# Patient Record
Sex: Male | Born: 1970 | Race: White | Hispanic: No | Marital: Married | Smoking: Never smoker
Health system: Southern US, Community
[De-identification: ages and names within clinical notes are randomized; demographics above are authoritative.]

## PROBLEM LIST (undated history)

## (undated) DIAGNOSIS — J309 Allergic rhinitis, unspecified: Secondary | ICD-10-CM

## (undated) DIAGNOSIS — Z973 Presence of spectacles and contact lenses: Secondary | ICD-10-CM

## (undated) DIAGNOSIS — G43909 Migraine, unspecified, not intractable, without status migrainosus: Secondary | ICD-10-CM

## (undated) DIAGNOSIS — M549 Dorsalgia, unspecified: Secondary | ICD-10-CM

## (undated) DIAGNOSIS — T7840XA Allergy, unspecified, initial encounter: Secondary | ICD-10-CM

## (undated) HISTORY — DX: Allergic rhinitis, unspecified: J30.9

## (undated) HISTORY — DX: Allergy, unspecified, initial encounter: T78.40XA

## (undated) HISTORY — DX: Presence of spectacles and contact lenses: Z97.3

## (undated) HISTORY — PX: LACERATION REPAIR: SHX5168

## (undated) HISTORY — DX: Dorsalgia, unspecified: M54.9

## (undated) HISTORY — PX: WISDOM TOOTH EXTRACTION: SHX21

## (undated) HISTORY — DX: Migraine, unspecified, not intractable, without status migrainosus: G43.909

---

## 2002-09-23 ENCOUNTER — Encounter: Payer: Self-pay | Admitting: Emergency Medicine

## 2002-09-23 ENCOUNTER — Emergency Department (HOSPITAL_COMMUNITY): Admission: EM | Admit: 2002-09-23 | Discharge: 2002-09-23 | Payer: Self-pay | Admitting: Emergency Medicine

## 2004-06-14 ENCOUNTER — Emergency Department (HOSPITAL_COMMUNITY): Admission: EM | Admit: 2004-06-14 | Discharge: 2004-06-14 | Payer: Self-pay | Admitting: Family Medicine

## 2005-06-13 ENCOUNTER — Emergency Department (HOSPITAL_COMMUNITY): Admission: EM | Admit: 2005-06-13 | Discharge: 2005-06-13 | Payer: Self-pay | Admitting: Family Medicine

## 2007-02-13 ENCOUNTER — Emergency Department (HOSPITAL_COMMUNITY): Admission: EM | Admit: 2007-02-13 | Discharge: 2007-02-13 | Payer: Self-pay | Admitting: Family Medicine

## 2007-12-13 ENCOUNTER — Emergency Department (HOSPITAL_COMMUNITY): Admission: EM | Admit: 2007-12-13 | Discharge: 2007-12-13 | Payer: Self-pay | Admitting: Family Medicine

## 2009-02-17 ENCOUNTER — Ambulatory Visit: Payer: Self-pay | Admitting: Family Medicine

## 2009-12-16 ENCOUNTER — Ambulatory Visit: Payer: Self-pay | Admitting: Family Medicine

## 2010-03-02 ENCOUNTER — Emergency Department (HOSPITAL_COMMUNITY): Admission: EM | Admit: 2010-03-02 | Discharge: 2010-03-03 | Payer: Self-pay | Admitting: Emergency Medicine

## 2010-03-02 ENCOUNTER — Emergency Department (HOSPITAL_COMMUNITY): Admission: EM | Admit: 2010-03-02 | Discharge: 2010-03-02 | Payer: Self-pay | Admitting: Emergency Medicine

## 2010-03-10 HISTORY — PX: HUMERUS FRACTURE SURGERY: SHX670

## 2010-10-10 HISTORY — PX: SHOULDER ARTHROSCOPY: SHX128

## 2011-07-28 ENCOUNTER — Emergency Department (HOSPITAL_COMMUNITY)
Admission: EM | Admit: 2011-07-28 | Discharge: 2011-07-29 | Disposition: A | Discharge status: Home / Self Care | Payer: BC Managed Care – PPO | Attending: Emergency Medicine | Admitting: Emergency Medicine

## 2011-07-28 ENCOUNTER — Emergency Department (HOSPITAL_COMMUNITY): Payer: BC Managed Care – PPO

## 2011-07-28 DIAGNOSIS — S0990XA Unspecified injury of head, initial encounter: Secondary | ICD-10-CM | POA: Insufficient documentation

## 2011-07-28 DIAGNOSIS — W108XXA Fall (on) (from) other stairs and steps, initial encounter: Secondary | ICD-10-CM | POA: Insufficient documentation

## 2011-07-28 DIAGNOSIS — S0180XA Unspecified open wound of other part of head, initial encounter: Secondary | ICD-10-CM | POA: Insufficient documentation

## 2011-07-29 ENCOUNTER — Encounter (HOSPITAL_COMMUNITY): Payer: Self-pay | Admitting: Radiology

## 2014-02-25 ENCOUNTER — Encounter: Payer: Self-pay | Admitting: Medical

## 2014-02-25 ENCOUNTER — Ambulatory Visit (INDEPENDENT_AMBULATORY_CARE_PROVIDER_SITE_OTHER): Payer: BC Managed Care – PPO | Admitting: Medical

## 2014-02-25 VITALS — BP 98/60 | HR 89 | Temp 97.6°F | Resp 14 | Ht 70.0 in | Wt 153.0 lb

## 2014-02-25 DIAGNOSIS — R059 Cough, unspecified: Secondary | ICD-10-CM

## 2014-02-25 DIAGNOSIS — R05 Cough: Secondary | ICD-10-CM

## 2014-02-25 MED ORDER — HYDROCODONE-HOMATROPINE 5-1.5 MG/5ML PO SYRP: 5.0000 mL | ORAL_SOLUTION | Freq: Three times a day (TID) | ORAL | Status: DC | PRN

## 2014-02-25 NOTE — Progress Notes (Signed)
   Subjective:   Erik Delacruz is a 43 y.o. male presenting on 02/25/2014 with Cough  He reports getting a cold in february, got a cough that has come and go, but been somewhat persistent since February.  Has some runny nose, some mild sneezing, some sore throat, some post nasal drainage.   Denies fever, no NVD.  Occasionally gets heartburn, but none recently.   No SOB, wheezing.  No DOE, no change in execise tolerance.   Using Claritin some, some cough syrup OTC.   Nonsmoker.  Lives in an old house.   No other aggravating or relieving factors.  No other complaint.  Review of Systems ROS as in subjective     Objective:    Filed Vitals:   02/25/14 1450  BP: 98/60  Pulse: 89  Temp: 97.6 F (36.4 C)  Resp: 14    General appearance: alert, no distress, WD/WN HEENT: normocephalic, sclerae anicteric, TMs pearly, nares patent, no discharge or erythema, pharynx normal Oral cavity: MMM, no lesions Neck: supple, no lymphadenopathy, no thyromegaly, no masses Heart: RRR, normal S1, S2, no murmurs Lungs: CTA bilaterally, no wheezes, rhonchi, or rales Abdomen: +bs, soft, non tender, non distended, no masses, no hepatomegaly, no splenomegaly Pulses: 2+ symmetric, upper and lower extremities, normal cap refill      Assessment: Encounter Diagnosis  Name Primary?  . Cough Yes     Plan: Cough likely related to allergic rhinitis and post nasals drainage.   Other possible causes include post viral cough or GERD, but less likely. Take claritin daily for the next several weeks, hydrate well, hycodan syrup prn, call/return if worse or not improving.   Lyfe was seen today for cough.  Diagnoses and associated orders for this visit:  Cough  Other Orders - HYDROcodone-homatropine (HYCODAN) 5-1.5 MG/5ML syrup; Take 5 mLs by mouth every 8 (eight) hours as needed for cough.     Return if symptoms worsen or fail to improve.

## 2014-06-20 ENCOUNTER — Encounter: Payer: Self-pay | Admitting: Medical

## 2014-06-20 ENCOUNTER — Ambulatory Visit (INDEPENDENT_AMBULATORY_CARE_PROVIDER_SITE_OTHER): Payer: BC Managed Care – PPO | Admitting: Medical

## 2014-06-20 VITALS — BP 98/60 | HR 76 | Temp 97.5°F | Resp 16 | Ht 70.0 in | Wt 150.0 lb

## 2014-06-20 DIAGNOSIS — Z113 Encounter for screening for infections with a predominantly sexual mode of transmission: Secondary | ICD-10-CM

## 2014-06-20 DIAGNOSIS — Z0000 Encounter for general adult medical examination without abnormal findings: Secondary | ICD-10-CM

## 2014-06-20 DIAGNOSIS — J309 Allergic rhinitis, unspecified: Secondary | ICD-10-CM

## 2014-06-20 DIAGNOSIS — Z23 Encounter for immunization: Secondary | ICD-10-CM

## 2014-06-20 LAB — POCT URINALYSIS DIPSTICK
BILIRUBIN UA: NEGATIVE
GLUCOSE UA: NEGATIVE
Ketones, UA: NEGATIVE
Leukocytes, UA: NEGATIVE
Nitrite, UA: NEGATIVE
PH UA: 5
Protein, UA: NEGATIVE
RBC UA: NEGATIVE
SPEC GRAV UA: 1.015
Urobilinogen, UA: NEGATIVE
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
## 2014-06-20 NOTE — Patient Instructions (Signed)
Preventative Care for Adults, Male       REGULAR HEALTH EXAMS:  A routine yearly physical is a good way to check in with your primary care provider about your health and preventive screening. It is also an opportunity to share updates about your health and any concerns you have, and receive a thorough all-over exam.   Most health insurance companies pay for at least some preventative services.  Check with your health plan for specific coverages.  WHAT PREVENTATIVE SERVICES DO MEN NEED?  Adult men should have their weight and blood pressure checked regularly.   Men age 3 and older should have their cholesterol levels checked regularly.  Beginning at age 79 and continuing to age 38, men should be screened for colorectal cancer.  Certain people should may need continued testing until age 84.  Other cancer screening may include exams for testicular and prostate cancer.  Updating vaccinations is part of preventative care.  Vaccinations help protect against diseases such as the flu.  Lab tests are generally done as part of preventative care to screen for anemia and blood disorders, to screen for problems with the kidneys and liver, to screen for bladder problems, to check blood sugar, and to check your cholesterol level.  Preventative services generally include counseling about diet, exercise, avoiding tobacco, drugs, excessive alcohol consumption, and sexually transmitted infections.    GENERAL RECOMMENDATIONS FOR GOOD HEALTH:  Healthy diet:  Eat a variety of foods, including fruit, vegetables, animal or vegetable protein, such as meat, fish, chicken, and eggs, or beans, lentils, tofu, and grains, such as rice.  Drink plenty of water daily.  Decrease saturated fat in the diet, avoid lots of red meat, processed foods, sweets, fast foods, and fried foods.  Exercise:  Aerobic exercise helps maintain good heart health. At least 30-40 minutes of moderate-intensity exercise is recommended.  For example, a brisk walk that increases your heart rate and breathing. This should be done on most days of the week.   Find a type of exercise or a variety of exercises that you enjoy so that it becomes a part of your daily life.  Examples are running, walking, swimming, water aerobics, and biking.  For motivation and support, explore group exercise such as aerobic class, spin class, Zumba, Yoga,or  martial arts, etc.    Set exercise goals for yourself, such as a certain weight goal, walk or run in a race such as a 5k walk/run.  Speak to your primary care provider about exercise goals.  Disease prevention:  If you smoke or chew tobacco, find out from your caregiver how to quit. It can literally save your life, no matter how long you have been a tobacco user. If you do not use tobacco, never begin.   Maintain a healthy diet and normal weight. Increased weight leads to problems with blood pressure and diabetes.   The Body Mass Index or BMI is a way of measuring how much of your body is fat. Having a BMI above 27 increases the risk of heart disease, diabetes, hypertension, stroke and other problems related to obesity. Your caregiver can help determine your BMI and based on it develop an exercise and dietary program to help you achieve or maintain this important measurement at a healthful level.  High blood pressure causes heart and blood vessel problems.  Persistent high blood pressure should be treated with medicine if weight loss and exercise do not work.   Fat and cholesterol leaves deposits in your arteries  that can block them. This causes heart disease and vessel disease elsewhere in your body.  If your cholesterol is found to be high, or if you have heart disease or certain other medical conditions, then you may need to have your cholesterol monitored frequently and be treated with medication.   Ask if you should have a stress test if your history suggests this. A stress test is a test done on  a treadmill that looks for heart disease. This test can find disease prior to there being a problem.  Avoid drinking alcohol in excess (more than two drinks per day).  Avoid use of street drugs. Do not share needles with anyone. Ask for professional help if you need assistance or instructions on stopping the use of alcohol, cigarettes, and/or drugs.  Brush your teeth twice a day with fluoride toothpaste, and floss once a day. Good oral hygiene prevents tooth decay and gum disease. The problems can be painful, unattractive, and can cause other health problems. Visit your dentist for a routine oral and dental check up and preventive care every 6-12 months.   Look at your skin regularly.  Use a mirror to look at your back. Notify your caregivers of changes in moles, especially if there are changes in shapes, colors, a size larger than a pencil eraser, an irregular border, or development of new moles.  Safety:  Use seatbelts 100% of the time, whether driving or as a passenger.  Use safety devices such as hearing protection if you work in environments with loud noise or significant background noise.  Use safety glasses when doing any work that could send debris in to the eyes.  Use a helmet if you ride a bike or motorcycle.  Use appropriate safety gear for contact sports.  Talk to your caregiver about gun safety.  Use sunscreen with a SPF (or skin protection factor) of 15 or greater.  Lighter skinned people are at a greater risk of skin cancer. Don't forget to also wear sunglasses in order to protect your eyes from too much damaging sunlight. Damaging sunlight can accelerate cataract formation.   Practice safe sex. Use condoms. Condoms are used for birth control and to help reduce the spread of sexually transmitted infections (or STIs).  Some of the STIs are gonorrhea (the clap), chlamydia, syphilis, trichomonas, herpes, HPV (human papilloma virus) and HIV (human immunodeficiency virus) which causes AIDS.  The herpes, HIV and HPV are viral illnesses that have no cure. These can result in disability, cancer and death.   Keep carbon monoxide and smoke detectors in your home functioning at all times. Change the batteries every 6 months or use a model that plugs into the wall.   Vaccinations:  Stay up to date with your tetanus shots and other required immunizations. You should have a booster for tetanus every 10 years. Be sure to get your flu shot every year, since 5%-20% of the [place] population comes down with the flu. The flu vaccine changes each year, so being vaccinated once is not enough. Get your shot in the fall, before the flu season peaks.   Other vaccines to consider:  Pneumococcal vaccine to protect against certain types of pneumonia.  This is normally recommended for adults age 42 or older.  However, adults younger than 41 years old with certain underlying conditions such as diabetes, heart or lung disease should also receive the vaccine.  Shingles vaccine to protect against Varicella Zoster if you are older than age 64, or younger  than 41 years old with certain underlying illness.  Hepatitis A vaccine to protect against a form of infection of the liver by a virus acquired from food.  Hepatitis B vaccine to protect against a form of infection of the liver by a virus acquired from blood or body fluids, particularly if you work in health care.  If you plan to travel internationally, check with your local health department for specific vaccination recommendations.  Cancer Screening:  Most routine colon cancer screening begins at the age of 41. On a yearly basis, doctors may provide special easy to use take-home tests to check for hidden blood in the stool. Sigmoidoscopy or colonoscopy can detect the earliest forms of colon cancer and is life saving. These tests use a small camera at the end of a tube to directly examine the colon. Speak to your caregiver about this at age 64, when routine  screening begins (and is repeated every 5 years unless early forms of pre-cancerous polyps or small growths are found).   At the age of 55 men usually start screening for prostate cancer every year. Screening may begin at a younger age for those with higher risk. Those at higher risk include African-Americans or having a family history of prostate cancer. There are two types of tests for prostate cancer:   Prostate-specific antigen (PSA) testing. Recent studies raise questions about prostate cancer using PSA and you should discuss this with your caregiver.   Digital rectal exam (in which your doctor's lubricated and gloved finger feels for enlargement of the prostate through the anus).   Screening for testicular cancer.  Do a monthly exam of your testicles. Gently roll each testicle between your thumb and fingers, feeling for any abnormal lumps. The best time to do this is after a hot shower or bath when the tissues are looser. Notify your caregivers of any lumps, tenderness or changes in size or shape immediately.

## 2014-06-21 LAB — COMPREHENSIVE METABOLIC PANEL
ALT: 17 U/L (ref 0–53)
AST: 18 U/L (ref 0–37)
Albumin: 4.3 g/dL (ref 3.5–5.2)
Alkaline Phosphatase: 50 U/L (ref 39–117)
BUN: 16 mg/dL (ref 6–23)
CALCIUM: 9 mg/dL (ref 8.4–10.5)
CHLORIDE: 107 meq/L (ref 96–112)
CO2: 21 meq/L (ref 19–32)
CREATININE: 0.93 mg/dL (ref 0.50–1.35)
Glucose, Bld: 92 mg/dL (ref 70–99)
POTASSIUM: 4 meq/L (ref 3.5–5.3)
Sodium: 139 mEq/L (ref 135–145)
Total Bilirubin: 0.3 mg/dL (ref 0.2–1.2)
Total Protein: 7 g/dL (ref 6.0–8.3)

## 2014-06-21 LAB — CBC
HEMATOCRIT: 39.5 % (ref 39.0–52.0)
Hemoglobin: 13.5 g/dL (ref 13.0–17.0)
MCH: 30 pg (ref 26.0–34.0)
MCHC: 34.2 g/dL (ref 30.0–36.0)
MCV: 87.8 fL (ref 78.0–100.0)
Platelets: 306 10*3/uL (ref 150–400)
RBC: 4.5 MIL/uL (ref 4.22–5.81)
RDW: 14 % (ref 11.5–15.5)
WBC: 7.4 10*3/uL (ref 4.0–10.5)

## 2014-06-21 LAB — LIPID PANEL
CHOL/HDL RATIO: 3 ratio
Cholesterol: 140 mg/dL (ref 0–200)
HDL: 46 mg/dL (ref >39)
LDL Cholesterol: 81 mg/dL (ref 0–99)
TRIGLYCERIDES: 65 mg/dL (ref <150)
VLDL: 13 mg/dL (ref 0–40)

## 2014-06-21 LAB — RPR

## 2014-06-21 LAB — GC/CHLAMYDIA PROBE AMP
CT PROBE, AMP APTIMA: NEGATIVE
GC Probe RNA: NEGATIVE

## 2014-06-21 LAB — HIV ANTIBODY (ROUTINE TESTING W REFLEX): HIV 1&2 Ab, 4th Generation: NONREACTIVE

## 2014-06-25 ENCOUNTER — Encounter: Payer: Self-pay | Admitting: Family Medicine

## 2014-06-26 ENCOUNTER — Telehealth: Payer: Self-pay

## 2014-06-26 NOTE — Telephone Encounter (Signed)
Reported labs to patient, all WNL from 6 days ago. I advised him letter was sent.

## 2016-08-12 ENCOUNTER — Encounter: Payer: Self-pay | Admitting: Medical

## 2016-08-12 ENCOUNTER — Ambulatory Visit (INDEPENDENT_AMBULATORY_CARE_PROVIDER_SITE_OTHER): Payer: BC Managed Care – PPO | Admitting: Medical

## 2016-08-12 VITALS — BP 101/70 | HR 68 | Ht 70.0 in | Wt 158.2 lb

## 2016-08-12 DIAGNOSIS — J309 Allergic rhinitis, unspecified: Secondary | ICD-10-CM | POA: Diagnosis not present

## 2016-08-12 DIAGNOSIS — Z7185 Encounter for immunization safety counseling: Secondary | ICD-10-CM

## 2016-08-12 DIAGNOSIS — R8290 Unspecified abnormal findings in urine: Secondary | ICD-10-CM

## 2016-08-12 DIAGNOSIS — G5603 Carpal tunnel syndrome, bilateral upper limbs: Secondary | ICD-10-CM

## 2016-08-12 DIAGNOSIS — M545 Low back pain, unspecified: Secondary | ICD-10-CM | POA: Insufficient documentation

## 2016-08-12 DIAGNOSIS — R5383 Other fatigue: Secondary | ICD-10-CM | POA: Diagnosis not present

## 2016-08-12 DIAGNOSIS — G4700 Insomnia, unspecified: Secondary | ICD-10-CM | POA: Insufficient documentation

## 2016-08-12 DIAGNOSIS — Z2821 Immunization not carried out because of patient refusal: Secondary | ICD-10-CM | POA: Insufficient documentation

## 2016-08-12 DIAGNOSIS — L409 Psoriasis, unspecified: Secondary | ICD-10-CM | POA: Diagnosis not present

## 2016-08-12 DIAGNOSIS — Z0000 Encounter for general adult medical examination without abnormal findings: Secondary | ICD-10-CM | POA: Diagnosis not present

## 2016-08-12 DIAGNOSIS — Z7189 Other specified counseling: Secondary | ICD-10-CM | POA: Diagnosis not present

## 2016-08-12 DIAGNOSIS — G43009 Migraine without aura, not intractable, without status migrainosus: Secondary | ICD-10-CM

## 2016-08-12 LAB — POCT URINALYSIS DIPSTICK
Bilirubin, UA: NEGATIVE
Blood, UA: NEGATIVE
Glucose, UA: NEGATIVE
Ketones, UA: NEGATIVE
Nitrite, UA: NEGATIVE
PROTEIN UA: NEGATIVE
Spec Grav, UA: >=1.03
UROBILINOGEN UA: NEGATIVE
pH, UA: 6

## 2016-08-12 LAB — CBC
HEMATOCRIT: 41.5 % (ref 38.5–50.0)
Hemoglobin: 14.1 g/dL (ref 13.2–17.1)
MCH: 29.8 pg (ref 27.0–33.0)
MCHC: 34 g/dL (ref 32.0–36.0)
MCV: 87.7 fL (ref 80.0–100.0)
MPV: 11 fL (ref 7.5–12.5)
Platelets: 287 10*3/uL (ref 140–400)
RBC: 4.73 MIL/uL (ref 4.20–5.80)
RDW: 13.4 % (ref 11.0–15.0)
WBC: 7.9 10*3/uL (ref 4.0–10.5)

## 2016-08-12 LAB — TSH: TSH: 2.21 m[IU]/L (ref 0.40–4.50)

## 2016-08-12 MED ORDER — GABAPENTIN 100 MG PO CAPS: ORAL_CAPSULE | ORAL | 2 refills | Status: DC

## 2016-08-12 MED ORDER — TRIAMCINOLONE ACETONIDE 0.1 % EX CREA: 1.0000 "application " | TOPICAL_CREAM | Freq: Two times a day (BID) | CUTANEOUS | 1 refills | Status: DC

## 2016-08-12 NOTE — Addendum Note (Signed)
Addended by: Carlena Hurl on: 08/12/2016 09:12 AM   Modules accepted: Orders
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
## 2016-08-13 LAB — COMPREHENSIVE METABOLIC PANEL
ALK PHOS: 55 U/L (ref 40–115)
ALT: 22 U/L (ref 9–46)
AST: 19 U/L (ref 10–40)
Albumin: 4.3 g/dL (ref 3.6–5.1)
BUN: 13 mg/dL (ref 7–25)
CHLORIDE: 106 mmol/L (ref 98–110)
CO2: 21 mmol/L (ref 20–31)
CREATININE: 1.02 mg/dL (ref 0.60–1.35)
Calcium: 9.5 mg/dL (ref 8.6–10.3)
Glucose, Bld: 99 mg/dL (ref 65–99)
Potassium: 4.4 mmol/L (ref 3.5–5.3)
SODIUM: 139 mmol/L (ref 135–146)
TOTAL PROTEIN: 7.5 g/dL (ref 6.1–8.1)
Total Bilirubin: 0.5 mg/dL (ref 0.2–1.2)

## 2016-08-13 LAB — LIPID PANEL
Cholesterol: 170 mg/dL (ref 125–200)
HDL: 43 mg/dL (ref >=40)
LDL CALC: 109 mg/dL (ref <130)
TRIGLYCERIDES: 88 mg/dL (ref <150)
Total CHOL/HDL Ratio: 4 Ratio (ref <=5.0)
VLDL: 18 mg/dL (ref <30)

## 2016-08-13 LAB — VITAMIN D 25 HYDROXY (VIT D DEFICIENCY, FRACTURES): VIT D 25 HYDROXY: 40 ng/mL (ref 30–100)

## 2016-08-13 LAB — TESTOSTERONE: TESTOSTERONE: 389 ng/dL (ref 250–827)

## 2016-08-14 LAB — URINE CULTURE: ORGANISM ID, BACTERIA: NO GROWTH

## 2018-09-13 ENCOUNTER — Encounter: Payer: Self-pay | Admitting: Medical

## 2018-09-13 ENCOUNTER — Ambulatory Visit (INDEPENDENT_AMBULATORY_CARE_PROVIDER_SITE_OTHER): Payer: BC Managed Care – PPO | Admitting: Medical

## 2018-09-13 VITALS — BP 124/76 | HR 68 | Temp 97.5°F | Resp 16 | Ht 70.0 in | Wt 162.0 lb

## 2018-09-13 DIAGNOSIS — J309 Allergic rhinitis, unspecified: Secondary | ICD-10-CM

## 2018-09-13 DIAGNOSIS — G43009 Migraine without aura, not intractable, without status migrainosus: Secondary | ICD-10-CM

## 2018-09-13 DIAGNOSIS — Z7189 Other specified counseling: Secondary | ICD-10-CM

## 2018-09-13 DIAGNOSIS — Z7185 Encounter for immunization safety counseling: Secondary | ICD-10-CM

## 2018-09-13 DIAGNOSIS — Z0000 Encounter for general adult medical examination without abnormal findings: Secondary | ICD-10-CM | POA: Diagnosis not present

## 2018-09-13 LAB — POCT URINALYSIS DIP (PROADVANTAGE DEVICE)
Bilirubin, UA: NEGATIVE
Blood, UA: NEGATIVE
Glucose, UA: NEGATIVE mg/dL
Ketones, POC UA: NEGATIVE mg/dL
Leukocytes, UA: NEGATIVE
Nitrite, UA: NEGATIVE
Protein Ur, POC: NEGATIVE mg/dL
Specific Gravity, Urine: 1.01
Urobilinogen, Ur: NEGATIVE
pH, UA: 6 (ref 5.0–8.0)
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
## 2018-09-13 NOTE — Patient Instructions (Signed)
Thanks for trusting [place] with your health care and for coming in for a physical today.  Below are some general recommendations I have for you:  Yearly screenings See your eye doctor yearly for routine vision care. See your dentist yearly for routine dental care including hygiene visits twice yearly. See me here yearly for a routine physical and preventative care visit   Specific Concerns today:  . Return in January for allergy testing   Please follow up yearly for a physical.   Preventative Care for Adults - Male      [place]:  A routine yearly physical is a good way to check in with your primary care provider about your health and preventive screening. It is also an opportunity to share updates about your health and any concerns you have, and receive a thorough all-over exam.   Most health insurance companies pay for at least some preventative services.  Check with your health plan for specific coverages.  WHAT PREVENTATIVE SERVICES DO MEN NEED?  Adult men should have their weight and blood pressure checked regularly.   Men age 53 and older should have their cholesterol levels checked regularly.  Beginning at age 44 and continuing to age 86, men should be screened for colorectal cancer.  Certain people may need continued testing until age 38.  Updating vaccinations is part of preventative care.  Vaccinations help protect against diseases such as the flu.  Osteoporosis is a disease in which the bones lose minerals and strength as we age. Men ages 63 and over should discuss this with their caregivers  Lab tests are generally done as part of preventative care to screen for anemia and blood disorders, to screen for problems with the kidneys and liver, to screen for bladder problems, to check blood sugar, and to check your cholesterol level.  Preventative services generally include counseling about diet, exercise, avoiding tobacco, drugs, excessive alcohol  consumption, and sexually transmitted infections.    GENERAL RECOMMENDATIONS FOR GOOD HEALTH:  Healthy diet:  Eat a variety of foods, including fruit, vegetables, animal or vegetable protein, such as meat, fish, chicken, and eggs, or beans, lentils, tofu, and grains, such as rice.  Drink plenty of water daily.  Decrease saturated fat in the diet, avoid lots of red meat, processed foods, sweets, fast foods, and fried foods.  Exercise:  Aerobic exercise helps maintain good heart health. At least 30-40 minutes of moderate-intensity exercise is recommended. For example, a brisk walk that increases your heart rate and breathing. This should be done on most days of the week.   Find a type of exercise or a variety of exercises that you enjoy so that it becomes a part of your daily life.  Examples are running, walking, swimming, water aerobics, and biking.  For motivation and support, explore group exercise such as aerobic class, spin class, Zumba, Yoga,or  martial arts, etc.    Set exercise goals for yourself, such as a certain weight goal, walk or run in a race such as a 5k walk/run.  Speak to your primary care provider about exercise goals.  Disease prevention:  If you smoke or chew tobacco, find out from your caregiver how to quit. It can literally save your life, no matter how long you have been a tobacco user. If you do not use tobacco, never begin.   Maintain a healthy diet and normal weight. Increased weight leads to problems with blood pressure and diabetes.   The Body Mass Index  or BMI is a way of measuring how much of your body is fat. Having a BMI above 27 increases the risk of heart disease, diabetes, hypertension, stroke and other problems related to obesity. Your caregiver can help determine your BMI and based on it develop an exercise and dietary program to help you achieve or maintain this important measurement at a healthful level.  High blood pressure causes heart and blood  vessel problems.  Persistent high blood pressure should be treated with medicine if weight loss and exercise do not work.   Fat and cholesterol leaves deposits in your arteries that can block them. This causes heart disease and vessel disease elsewhere in your body.  If your cholesterol is found to be high, or if you have heart disease or certain other medical conditions, then you may need to have your cholesterol monitored frequently and be treated with medication.   Ask if you should have a cardiac stress test if your history suggests this. A stress test is a test done on a treadmill that looks for heart disease. This test can find disease prior to there being a problem.  Osteoporosis is a disease in which the bones lose minerals and strength as we age. This can result in serious bone fractures. Risk of osteoporosis can be identified using a bone density scan. Men ages 40 and over should discuss this with their caregivers. Ask your caregiver whether you should be taking a calcium supplement and Vitamin D, to reduce the rate of osteoporosis.   Avoid drinking alcohol in excess (more than two drinks per day).  Avoid use of street drugs. Do not share needles with anyone. Ask for professional help if you need assistance or instructions on stopping the use of alcohol, cigarettes, and/or drugs.  Brush your teeth twice a day with fluoride toothpaste, and floss once a day. Good oral hygiene prevents tooth decay and gum disease. The problems can be painful, unattractive, and can cause other health problems. Visit your dentist for a routine oral and dental check up and preventive care every 6-12 months.   Look at your skin regularly.  Use a mirror to look at your back. Notify your caregivers of changes in moles, especially if there are changes in shapes, colors, a size larger than a pencil eraser, an irregular border, or development of new moles.  Safety:  Use seatbelts 100% of the time, whether driving or as  a passenger.  Use safety devices such as hearing protection if you work in environments with loud noise or significant background noise.  Use safety glasses when doing any work that could send debris in to the eyes.  Use a helmet if you ride a bike or motorcycle.  Use appropriate safety gear for contact sports.  Talk to your caregiver about gun safety.  Use sunscreen with a SPF (or skin protection factor) of 15 or greater.  Lighter skinned people are at a greater risk of skin cancer. Don't forget to also wear sunglasses in order to protect your eyes from too much damaging sunlight. Damaging sunlight can accelerate cataract formation.   Practice safe sex. Use condoms. Condoms are used for birth control and to help reduce the spread of sexually transmitted infections (or STIs).  Some of the STIs are gonorrhea (the clap), chlamydia, syphilis, trichomonas, herpes, HPV (human papilloma virus) and HIV (human immunodeficiency virus) which causes AIDS. The herpes, HIV and HPV are viral illnesses that have no cure. These can result in disability, cancer and  death.   Keep carbon monoxide and smoke detectors in your home functioning at all times. Change the batteries every 6 months or use a model that plugs into the wall.   Vaccinations:  Stay up to date with your tetanus shots and other required immunizations. You should have a booster for tetanus every 10 years. Be sure to get your flu shot every year, since 5%-20% of the [place] population comes down with the flu. The flu vaccine changes each year, so being vaccinated once is not enough. Get your shot in the fall, before the flu season peaks.   Other vaccines to consider:  Human Papilloma Virus or HPV causes cancer of the cervix, and other infections that can be transmitted from person to person. There is a vaccine for HPV, and males should get immunized between the ages of 71 and 21. It requires a series of 3 shots.   Pneumococcal vaccine to protect against  certain types of pneumonia.  This is normally recommended for adults age 51 or older.  However, adults younger than 45 years old with certain underlying conditions such as diabetes, heart or lung disease should also receive the vaccine.  Shingles vaccine to protect against Varicella Zoster if you are older than age 51, or younger than 45 years old with certain underlying illness.  If you have not had the Shingrix vaccine, please call your insurer to inquire about coverage for the Shingrix vaccine given in 2 doses.   Some insurers cover this vaccine after age 88, some cover this after age 77.  If your insurer covers this, then call to schedule appointment to have this vaccine here  Hepatitis A vaccine to protect against a form of infection of the liver by a virus acquired from food.  Hepatitis B vaccine to protect against a form of infection of the liver by a virus acquired from blood or body fluids, particularly if you work in health care.  If you plan to travel internationally, check with your local health department for specific vaccination recommendations.   What should I know about cancer screening? Many types of cancers can be detected early and may often be prevented. Lung Cancer  You should be screened every year for lung cancer if: ? You are a current smoker who has smoked for at least 30 years. ? You are a former smoker who has quit within the past 15 years.  Talk to your health care provider about your screening options, when you should start screening, and how often you should be screened.  Colorectal Cancer  Routine colorectal cancer screening usually begins at 46 years of age and should be repeated every 5-10 years until you are 45 years old. You may need to be screened more often if early forms of precancerous polyps or small growths are found. Your health care provider may recommend screening at an earlier age if you have risk factors for colon cancer.  Your health care  provider may recommend using home test kits to check for hidden blood in the stool.  A small camera at the end of a tube can be used to examine your colon (sigmoidoscopy or colonoscopy). This checks for the earliest forms of colorectal cancer.  Prostate and Testicular Cancer  Depending on your age and overall health, your health care provider may do certain tests to screen for prostate and testicular cancer.  Talk to your health care provider about any symptoms or concerns you have about testicular or prostate cancer.  Skin  Cancer  Check your skin from head to toe regularly.  Tell your health care provider about any new moles or changes in moles, especially if: ? There is a change in a mole's size, shape, or color. ? You have a mole that is larger than a pencil eraser.  Always use sunscreen. Apply sunscreen liberally and repeat throughout the day.  Protect yourself by wearing long sleeves, pants, a wide-brimmed hat, and sunglasses when outside.

## 2018-09-14 LAB — LIPID PANEL
Chol/HDL Ratio: 4.4 ratio (ref 0.0–5.0)
Cholesterol, Total: 177 mg/dL (ref 100–199)
HDL: 40 mg/dL (ref >39)
LDL Calculated: 118 mg/dL — ABNORMAL HIGH (ref 0–99)
Triglycerides: 93 mg/dL (ref 0–149)
VLDL Cholesterol Cal: 19 mg/dL (ref 5–40)

## 2018-09-14 LAB — COMPREHENSIVE METABOLIC PANEL
ALT: 42 IU/L (ref 0–44)
AST: 31 IU/L (ref 0–40)
Albumin/Globulin Ratio: 1.4 (ref 1.2–2.2)
Albumin: 4.5 g/dL (ref 3.5–5.5)
Alkaline Phosphatase: 64 IU/L (ref 39–117)
BUN/Creatinine Ratio: 14 (ref 9–20)
BUN: 12 mg/dL (ref 6–24)
Bilirubin Total: 0.4 mg/dL (ref 0.0–1.2)
CO2: 23 mmol/L (ref 20–29)
Calcium: 9.5 mg/dL (ref 8.7–10.2)
Chloride: 103 mmol/L (ref 96–106)
Creatinine, Ser: 0.88 mg/dL (ref 0.76–1.27)
GFR calc Af Amer: 120 mL/min/{1.73_m2} (ref >59)
GFR calc non Af Amer: 104 mL/min/{1.73_m2} (ref >59)
GLUCOSE: 89 mg/dL (ref 65–99)
Globulin, Total: 3.2 g/dL (ref 1.5–4.5)
Potassium: 4.6 mmol/L (ref 3.5–5.2)
Sodium: 141 mmol/L (ref 134–144)
Total Protein: 7.7 g/dL (ref 6.0–8.5)

## 2018-09-14 LAB — CBC
Hematocrit: 42.8 % (ref 37.5–51.0)
Hemoglobin: 14.4 g/dL (ref 13.0–17.7)
MCH: 29.4 pg (ref 26.6–33.0)
MCHC: 33.6 g/dL (ref 31.5–35.7)
MCV: 87 fL (ref 79–97)
Platelets: 293 10*3/uL (ref 150–450)
RBC: 4.9 x10E6/uL (ref 4.14–5.80)
RDW: 12.6 % (ref 12.3–15.4)
WBC: 6.8 10*3/uL (ref 3.4–10.8)

## 2019-04-18 ENCOUNTER — Telehealth: Payer: Self-pay | Admitting: Medical

## 2019-04-18 NOTE — Telephone Encounter (Signed)
Received a form for adoption to complete.  I completed most of it.  Please call and ask about some of the other questions on the form as I do not believe we have completed a form like this for him before and then let me complete the rest after you have asked those other questions.
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
## 2019-04-19 NOTE — Telephone Encounter (Signed)
Left message for pt to call back  °

## 2019-04-22 NOTE — Telephone Encounter (Signed)
Melissa, copy and return form.  Not sure if we charge for form completion.  It was a somewhat lengthy form to complete, but was a physical related form.   His physical was 09/2018.

## 2019-09-16 ENCOUNTER — Encounter: Payer: BC Managed Care – PPO | Admitting: Medical

## 2019-09-19 ENCOUNTER — Encounter: Payer: Self-pay | Admitting: Medical

## 2019-12-19 ENCOUNTER — Ambulatory Visit: Payer: Self-pay | Attending: Family

## 2019-12-19 DIAGNOSIS — Z23 Encounter for immunization: Secondary | ICD-10-CM

## 2019-12-19 NOTE — Progress Notes (Signed)
   Covid-19 Vaccination Clinic  Name:  Jona Zappone    MRN: [id number] DOB: Sep 29, 1971  12/19/2019  Mr. Ray was observed post Covid-19 immunization for 15 minutes without incident. He was provided with Vaccine Information Sheet and instruction to access the V-Safe system.   Mr. Viverette was instructed to call 911 with any severe reactions post vaccine: Marland Kitchen Difficulty breathing  . Swelling of face and throat  . A fast heartbeat  . A bad rash all over body  . Dizziness and weakness   Immunizations Administered    Name Date Dose VIS Date Route   Moderna COVID-19 Vaccine 12/19/2019 10:51 AM 0.5 mL 09/10/2019 Intramuscular   Manufacturer: Moderna   Lot: [id number]   [place]: [id number]

## 2020-01-21 ENCOUNTER — Ambulatory Visit: Payer: Self-pay | Attending: Family

## 2020-01-21 DIAGNOSIS — Z23 Encounter for immunization: Secondary | ICD-10-CM

## 2020-01-21 NOTE — Progress Notes (Signed)
   Covid-19 Vaccination Clinic  Name:  Erik Delacruz    MRN: [id number] DOB: [birth year]  01/21/2020  [name] was observed post Covid-19 immunization for 15 minutes without incident. He was provided with Vaccine Information Sheet and instruction to access the V-Safe system.   [name] was instructed to call 911 with any severe reactions post vaccine: Marland Kitchen Difficulty breathing  . Swelling of face and throat  . A fast heartbeat  . A bad rash all over body  . Dizziness and weakness   Immunizations Administered    Name Date Dose VIS Date Route   Moderna COVID-19 Vaccine 01/21/2020 12:32 PM 0.5 mL 09/10/2019 Intramuscular   Manufacturer: Moderna   Lot: [id number]   [place]: [id number]

## 2020-08-07 ENCOUNTER — Ambulatory Visit: Payer: Self-pay | Attending: Family

## 2020-08-07 DIAGNOSIS — Z23 Encounter for immunization: Secondary | ICD-10-CM

## 2020-09-24 NOTE — Progress Notes (Signed)
   Covid-19 Vaccination Clinic  Name:  Sheikh Leverich    MRN: [id number] DOB: December 13, 1970  09/24/2020  Mr. Madeira was observed post Covid-19 immunization for 15 minutes without incident. He was provided with Vaccine Information Sheet and instruction to access the V-Safe system.   Mr. Aust was instructed to call 911 with any severe reactions post vaccine: Marland Kitchen Difficulty breathing  . Swelling of face and throat  . A fast heartbeat  . A bad rash all over body  . Dizziness and weakness   Immunizations Administered    No immunizations on file.

## 2021-02-17 ENCOUNTER — Other Ambulatory Visit: Payer: Self-pay

## 2021-02-17 ENCOUNTER — Ambulatory Visit: Payer: BC Managed Care – PPO | Admitting: Medical

## 2021-02-17 ENCOUNTER — Encounter: Payer: Self-pay | Admitting: Medical

## 2021-02-17 VITALS — BP 110/76 | HR 65 | Ht 70.0 in | Wt 156.6 lb

## 2021-02-17 DIAGNOSIS — Z13220 Encounter for screening for lipoid disorders: Secondary | ICD-10-CM

## 2021-02-17 DIAGNOSIS — Z23 Encounter for immunization: Secondary | ICD-10-CM | POA: Diagnosis not present

## 2021-02-17 DIAGNOSIS — Z0000 Encounter for general adult medical examination without abnormal findings: Secondary | ICD-10-CM

## 2021-02-17 DIAGNOSIS — Z7185 Encounter for immunization safety counseling: Secondary | ICD-10-CM

## 2021-02-17 DIAGNOSIS — R0789 Other chest pain: Secondary | ICD-10-CM
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
## 2021-02-18 LAB — CBC WITH DIFFERENTIAL/PLATELET
Basophils Absolute: 0.1 10*3/uL (ref 0.0–0.2)
Basos: 1 %
EOS (ABSOLUTE): 0.1 10*3/uL (ref 0.0–0.4)
Eos: 1 %
Hematocrit: 43.2 % (ref 37.5–51.0)
Hemoglobin: 14.8 g/dL (ref 13.0–17.7)
Immature Grans (Abs): 0 10*3/uL (ref 0.0–0.1)
Immature Granulocytes: 0 %
Lymphocytes Absolute: 3.3 10*3/uL — ABNORMAL HIGH (ref 0.7–3.1)
Lymphs: 35 %
MCH: 29.7 pg (ref 26.6–33.0)
MCHC: 34.3 g/dL (ref 31.5–35.7)
MCV: 87 fL (ref 79–97)
Monocytes Absolute: 0.9 10*3/uL (ref 0.1–0.9)
Monocytes: 9 %
Neutrophils Absolute: 5.2 10*3/uL (ref 1.4–7.0)
Neutrophils: 54 %
Platelets: 307 10*3/uL (ref 150–450)
RBC: 4.99 x10E6/uL (ref 4.14–5.80)
RDW: 12.5 % (ref 11.6–15.4)
WBC: 9.6 10*3/uL (ref 3.4–10.8)

## 2021-02-18 LAB — COMPREHENSIVE METABOLIC PANEL
ALT: 24 IU/L (ref 0–44)
AST: 24 IU/L (ref 0–40)
Albumin/Globulin Ratio: 1.7 (ref 1.2–2.2)
Albumin: 4.8 g/dL (ref 4.0–5.0)
Alkaline Phosphatase: 71 IU/L (ref 44–121)
BUN/Creatinine Ratio: 14 (ref 9–20)
BUN: 14 mg/dL (ref 6–24)
Bilirubin Total: 0.6 mg/dL (ref 0.0–1.2)
CO2: 19 mmol/L — ABNORMAL LOW (ref 20–29)
Calcium: 9.9 mg/dL (ref 8.7–10.2)
Chloride: 101 mmol/L (ref 96–106)
Creatinine, Ser: 0.98 mg/dL (ref 0.76–1.27)
Globulin, Total: 2.8 g/dL (ref 1.5–4.5)
Glucose: 87 mg/dL (ref 65–99)
Potassium: 4.5 mmol/L (ref 3.5–5.2)
Sodium: 139 mmol/L (ref 134–144)
Total Protein: 7.6 g/dL (ref 6.0–8.5)
eGFR: 96 mL/min/{1.73_m2} (ref >59)

## 2021-02-18 LAB — LIPID PANEL
Chol/HDL Ratio: 3.5 ratio (ref 0.0–5.0)
Cholesterol, Total: 170 mg/dL (ref 100–199)
HDL: 48 mg/dL (ref >39)
LDL Chol Calc (NIH): 107 mg/dL — ABNORMAL HIGH (ref 0–99)
Triglycerides: 80 mg/dL (ref 0–149)
VLDL Cholesterol Cal: 15 mg/dL (ref 5–40)

## 2021-02-18 LAB — SEDIMENTATION RATE: Sed Rate: 11 mm/hr (ref 0–15)

## 2021-02-19 ENCOUNTER — Ambulatory Visit: Payer: BC Managed Care – PPO | Attending: Internal Medicine

## 2021-02-19 DIAGNOSIS — Z20822 Contact with and (suspected) exposure to covid-19: Secondary | ICD-10-CM | POA: Insufficient documentation

## 2021-02-20 LAB — NOVEL CORONAVIRUS, NAA: SARS-CoV-2, NAA: NOT DETECTED

## 2021-02-20 LAB — SARS-COV-2, NAA 2 DAY TAT

## 2021-03-22 ENCOUNTER — Other Ambulatory Visit: Payer: Self-pay | Admitting: Medical

## 2021-03-22 DIAGNOSIS — R0789 Other chest pain: Secondary | ICD-10-CM

## 2021-03-23 ENCOUNTER — Other Ambulatory Visit: Payer: Self-pay

## 2021-03-23 DIAGNOSIS — Z1211 Encounter for screening for malignant neoplasm of colon: Secondary | ICD-10-CM

## 2021-03-31 ENCOUNTER — Ambulatory Visit
Admission: RE | Admit: 2021-03-31 | Discharge: 2021-03-31 | Disposition: A | Discharge status: Home / Self Care | Payer: BC Managed Care – PPO | Source: Ambulatory Visit | Attending: Medical | Admitting: Medical

## 2021-03-31 DIAGNOSIS — R0789 Other chest pain: Secondary | ICD-10-CM

## 2021-04-27 LAB — COLOGUARD: Cologuard: NEGATIVE

## 2021-05-03 ENCOUNTER — Telehealth: Payer: Self-pay | Admitting: Medical

## 2021-05-03 NOTE — Telephone Encounter (Signed)
Please let them know that the Cologuard screening for colon cancer was negative.  This indicates a lower likelihood that colorectal cancer is present.   Lets plan to repeat this in 3 years.  However, if the develop bowel changes, blood in stool, unexpected weight loss, or other new bowel changes, then recheck.

## 2021-05-06 ENCOUNTER — Encounter: Payer: Self-pay | Admitting: Medical

## 2021-08-12 ENCOUNTER — Encounter: Payer: Self-pay | Admitting: Family Medicine

## 2021-08-12 ENCOUNTER — Other Ambulatory Visit: Payer: Self-pay

## 2021-08-12 ENCOUNTER — Telehealth: Payer: BC Managed Care – PPO | Admitting: Family Medicine

## 2021-08-12 VITALS — Temp 99.1°F | Wt 156.0 lb

## 2021-08-12 DIAGNOSIS — A084 Viral intestinal infection, unspecified: Secondary | ICD-10-CM | POA: Diagnosis not present

## 2021-08-12 MED ORDER — ONDANSETRON 4 MG PO TBDP: 4.0000 mg | ORAL_TABLET | Freq: Three times a day (TID) | ORAL | 0 refills | Status: DC | PRN

## 2021-08-12 NOTE — Progress Notes (Signed)
   Subjective:    Patient ID: Erik Delacruz, male    DOB: 01/20/1971, 50 y.o.   MRN: [id number]  HPI Documentation for virtual audio and video telecommunications through [place] encounter: The patient was located at home. 2 patient identifiers used.  The provider was located in the office. The patient did consent to this visit and is aware of possible charges through their insurance for this visit. The other persons participating in this telemedicine service were none. Time spent on call was 4 minutes and in review of previous records >16 minutes total for counseling and coordination of care. This virtual service is not related to other E/M service within previous 7 days.  He states that on October 31 he developed nausea, vomiting with a slight fever up to 99.1 with diarrhea, anorexia and some abdominal distress.  He felt a little bit better the next day but the symptoms recurred yesterday later on in the day.  Today he feels slightly better.  His wife and daughter have had similar episodes with the vomiting.  Review of Systems     Objective:   Physical Exam Alert and in no distress otherwise not examined       Assessment & Plan:  Viral gastroenteritis - Plan: ondansetron (ZOFRAN ODT) 4 MG disintegrating tablet I explained that I thought this was a viral syndrome especially since his daughter and wife also have some trouble with it.  I will call in Zofran for him.  Once the nausea is under control recommend sips of liquid or Gatorade and then add food as tolerated.  Recommend Imodium up to 8 tablets/day.  I will also give him a note for work if he needs 1.

## 2022-02-21 ENCOUNTER — Ambulatory Visit: Payer: BC Managed Care – PPO | Admitting: Medical

## 2022-02-21 ENCOUNTER — Encounter: Payer: Self-pay | Admitting: Medical

## 2022-02-21 VITALS — BP 110/68 | HR 76 | Ht 70.0 in | Wt 164.0 lb

## 2022-02-21 DIAGNOSIS — G479 Sleep disorder, unspecified: Secondary | ICD-10-CM | POA: Diagnosis not present

## 2022-02-21 DIAGNOSIS — Z0000 Encounter for general adult medical examination without abnormal findings: Secondary | ICD-10-CM

## 2022-02-21 DIAGNOSIS — Z1159 Encounter for screening for other viral diseases: Secondary | ICD-10-CM

## 2022-02-21 DIAGNOSIS — Z13220 Encounter for screening for lipoid disorders: Secondary | ICD-10-CM | POA: Diagnosis not present
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
## 2022-02-22 LAB — HEPATITIS C ANTIBODY: Hep C Virus Ab: NONREACTIVE

## 2022-02-22 LAB — COMPREHENSIVE METABOLIC PANEL
ALT: 29 IU/L (ref 0–44)
AST: 22 IU/L (ref 0–40)
Albumin/Globulin Ratio: 1.6 (ref 1.2–2.2)
Albumin: 4.2 g/dL (ref 4.0–5.0)
Alkaline Phosphatase: 61 IU/L (ref 44–121)
BUN/Creatinine Ratio: 15 (ref 9–20)
BUN: 15 mg/dL (ref 6–24)
Bilirubin Total: 0.5 mg/dL (ref 0.0–1.2)
CO2: 20 mmol/L (ref 20–29)
Calcium: 9.3 mg/dL (ref 8.7–10.2)
Chloride: 108 mmol/L — ABNORMAL HIGH (ref 96–106)
Creatinine, Ser: 0.99 mg/dL (ref 0.76–1.27)
Globulin, Total: 2.7 g/dL (ref 1.5–4.5)
Glucose: 99 mg/dL (ref 70–99)
Potassium: 4.4 mmol/L (ref 3.5–5.2)
Sodium: 139 mmol/L (ref 134–144)
Total Protein: 6.9 g/dL (ref 6.0–8.5)
eGFR: 94 mL/min/{1.73_m2} (ref >59)

## 2022-02-22 LAB — URINALYSIS
Bilirubin, UA: NEGATIVE
Glucose, UA: NEGATIVE
Ketones, UA: NEGATIVE
Leukocytes,UA: NEGATIVE
Nitrite, UA: NEGATIVE
Protein,UA: NEGATIVE
RBC, UA: NEGATIVE
Specific Gravity, UA: 1.022 (ref 1.005–1.030)
Urobilinogen, Ur: 0.2 mg/dL (ref 0.2–1.0)
pH, UA: 5 (ref 5.0–7.5)

## 2022-02-22 LAB — CBC
Hematocrit: 40.6 % (ref 37.5–51.0)
Hemoglobin: 13.9 g/dL (ref 13.0–17.7)
MCH: 30.2 pg (ref 26.6–33.0)
MCHC: 34.2 g/dL (ref 31.5–35.7)
MCV: 88 fL (ref 79–97)
Platelets: 279 10*3/uL (ref 150–450)
RBC: 4.61 x10E6/uL (ref 4.14–5.80)
RDW: 13.1 % (ref 11.6–15.4)
WBC: 6.2 10*3/uL (ref 3.4–10.8)

## 2022-02-22 LAB — LIPID PANEL
Chol/HDL Ratio: 3.7 ratio (ref 0.0–5.0)
Cholesterol, Total: 158 mg/dL (ref 100–199)
HDL: 43 mg/dL (ref >39)
LDL Chol Calc (NIH): 102 mg/dL — ABNORMAL HIGH (ref 0–99)
Triglycerides: 64 mg/dL (ref 0–149)
VLDL Cholesterol Cal: 13 mg/dL (ref 5–40)

## 2022-06-15 ENCOUNTER — Encounter: Payer: Self-pay | Admitting: Internal Medicine

## 2022-07-19 ENCOUNTER — Encounter: Payer: Self-pay | Admitting: Internal Medicine

## 2023-01-06 IMAGING — CR DG CHEST 2V
2 series · 2 of 2 positions shown · non-contrast
Comparison: 09/23/2002

CLINICAL DATA: 49-year-old male with chest wall pain

EXAM:
CHEST - 2 VIEW

[w chest pa]
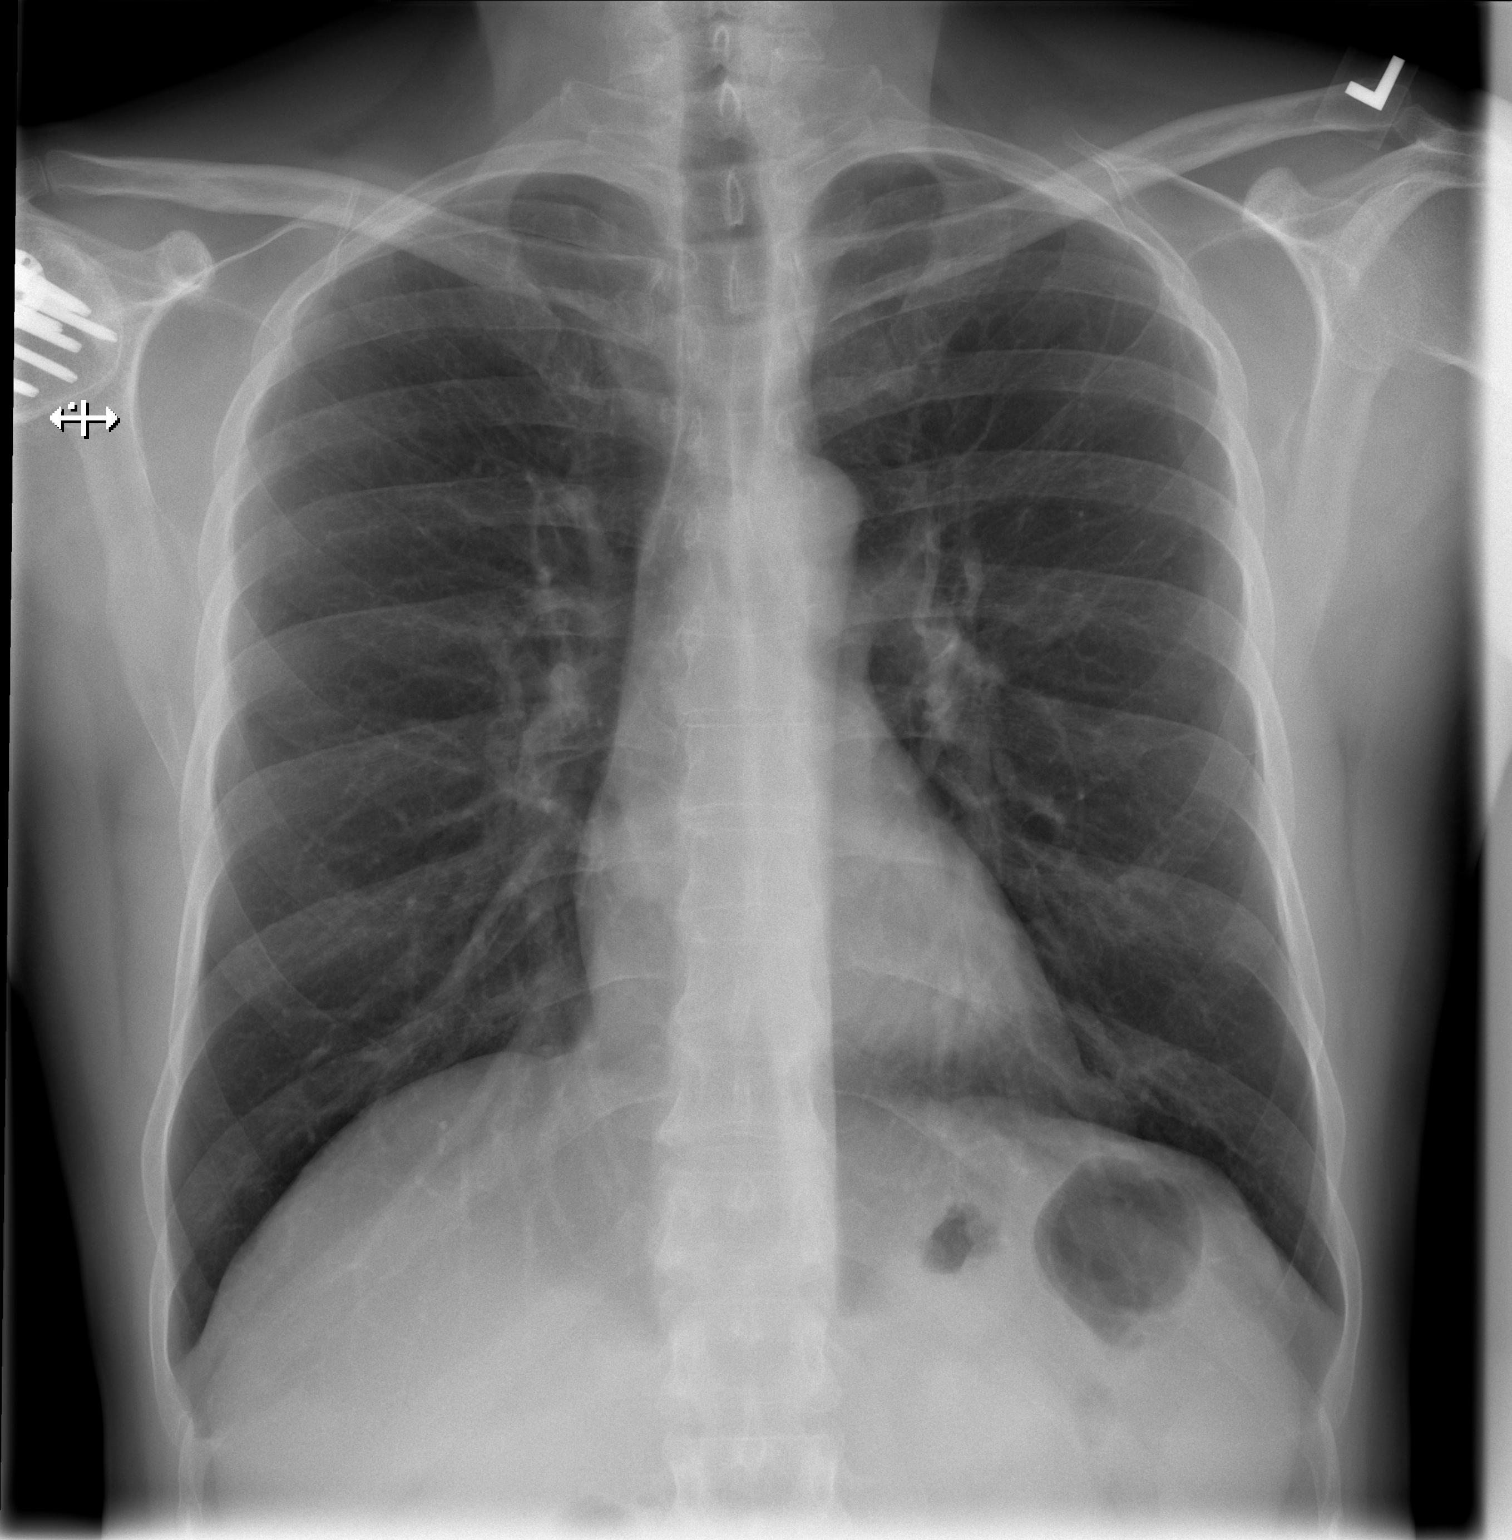

[w chest lat]
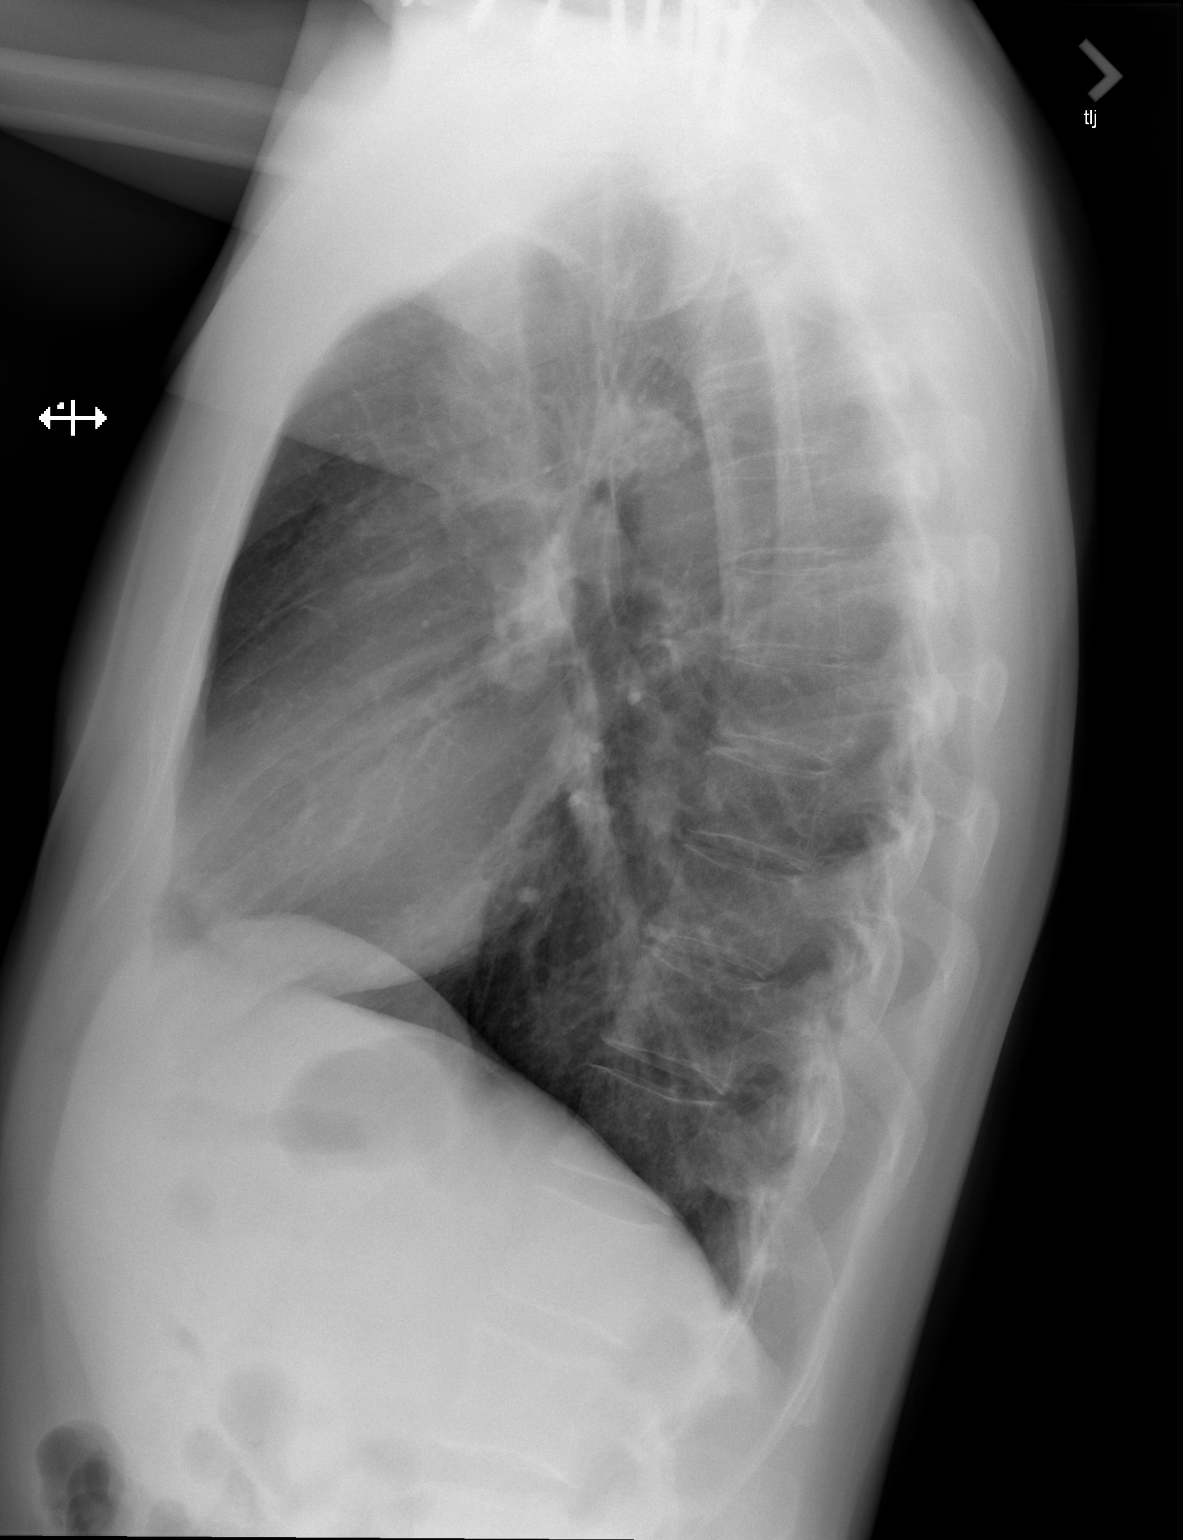

[2 of 2 positions shown; findings below may reference images not displayed]

FINDINGS: Cardiomediastinal silhouette unchanged in size and contour. No
evidence of central vascular congestion. No interlobular septal
thickening. No pneumothorax or pleural effusion. No confluent
airspace disease.

No acute displaced fracture.

Surgical changes of the right humeral head incompletely imaged
IMPRESSION: Negative for acute cardiopulmonary disease

## 2023-03-01 ENCOUNTER — Encounter: Payer: Self-pay | Admitting: Medical

## 2023-03-01 ENCOUNTER — Ambulatory Visit (INDEPENDENT_AMBULATORY_CARE_PROVIDER_SITE_OTHER): Payer: BC Managed Care – PPO | Admitting: Medical

## 2023-03-01 VITALS — BP 120/70 | HR 64 | Ht 71.0 in | Wt 160.8 lb

## 2023-03-01 DIAGNOSIS — Z0000 Encounter for general adult medical examination without abnormal findings: Secondary | ICD-10-CM | POA: Diagnosis not present

## 2023-03-01 DIAGNOSIS — Z13220 Encounter for screening for lipoid disorders: Secondary | ICD-10-CM | POA: Diagnosis not present

## 2023-03-01 LAB — POCT URINALYSIS DIP (PROADVANTAGE DEVICE)
Bilirubin, UA: NEGATIVE
Blood, UA: NEGATIVE
Glucose, UA: NEGATIVE mg/dL
Ketones, POC UA: NEGATIVE mg/dL
Leukocytes, UA: NEGATIVE
Nitrite, UA: NEGATIVE
Protein Ur, POC: NEGATIVE mg/dL
Specific Gravity, Urine: 1.02
Urobilinogen, Ur: NEGATIVE
pH, UA: 6 (ref 5.0–8.0)
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
## 2023-03-02 LAB — TSH: TSH: 2.3 u[IU]/mL (ref 0.450–4.500)

## 2023-03-02 LAB — CMP14+EGFR
ALT: 28 IU/L (ref 0–44)
AST: 25 IU/L (ref 0–40)
Albumin/Globulin Ratio: 1.6 (ref 1.2–2.2)
Albumin: 4.4 g/dL (ref 4.1–5.1)
Alkaline Phosphatase: 75 IU/L (ref 44–121)
BUN/Creatinine Ratio: 14 (ref 9–20)
BUN: 14 mg/dL (ref 6–24)
Bilirubin Total: 0.3 mg/dL (ref 0.0–1.2)
CO2: 17 mmol/L — ABNORMAL LOW (ref 20–29)
Calcium: 9.6 mg/dL (ref 8.7–10.2)
Chloride: 106 mmol/L (ref 96–106)
Creatinine, Ser: 1 mg/dL (ref 0.76–1.27)
Globulin, Total: 2.7 g/dL (ref 1.5–4.5)
Glucose: 101 mg/dL — ABNORMAL HIGH (ref 70–99)
Potassium: 4.4 mmol/L (ref 3.5–5.2)
Sodium: 139 mmol/L (ref 134–144)
Total Protein: 7.1 g/dL (ref 6.0–8.5)
eGFR: 92 mL/min/{1.73_m2} (ref >59)

## 2023-03-02 LAB — CBC WITH DIFFERENTIAL/PLATELET
Basophils Absolute: 0.1 10*3/uL (ref 0.0–0.2)
Basos: 1 %
EOS (ABSOLUTE): 0.1 10*3/uL (ref 0.0–0.4)
Eos: 1 %
Hematocrit: 42 % (ref 37.5–51.0)
Hemoglobin: 14.5 g/dL (ref 13.0–17.7)
Immature Grans (Abs): 0 10*3/uL (ref 0.0–0.1)
Immature Granulocytes: 0 %
Lymphocytes Absolute: 2.7 10*3/uL (ref 0.7–3.1)
Lymphs: 39 %
MCH: 29.8 pg (ref 26.6–33.0)
MCHC: 34.5 g/dL (ref 31.5–35.7)
MCV: 86 fL (ref 79–97)
Monocytes Absolute: 0.7 10*3/uL (ref 0.1–0.9)
Monocytes: 10 %
Neutrophils Absolute: 3.4 10*3/uL (ref 1.4–7.0)
Neutrophils: 49 %
Platelets: 290 10*3/uL (ref 150–450)
RBC: 4.87 x10E6/uL (ref 4.14–5.80)
RDW: 11.8 % (ref 11.6–15.4)
WBC: 6.9 10*3/uL (ref 3.4–10.8)

## 2023-03-02 LAB — LIPID PANEL
Chol/HDL Ratio: 3.8 ratio (ref 0.0–5.0)
Cholesterol, Total: 165 mg/dL (ref 100–199)
HDL: 44 mg/dL (ref >39)
LDL Chol Calc (NIH): 103 mg/dL — ABNORMAL HIGH (ref 0–99)
Triglycerides: 96 mg/dL (ref 0–149)
VLDL Cholesterol Cal: 18 mg/dL (ref 5–40)

## 2023-03-02 NOTE — Progress Notes (Signed)
See if Erik Delacruz can add aldosterone and cortisol level or have him return for re-draw if necessary due to low CO2

## 2023-03-03 LAB — SPECIMEN STATUS REPORT

## 2023-03-08 LAB — CORTISOL: Cortisol: 9.4 ug/dL (ref 6.2–19.4)

## 2023-03-08 LAB — ALDOSTERONE: Aldosterone: 5.3 ng/dL (ref 0.0–30.0)

## 2023-03-09 NOTE — Progress Notes (Signed)
Results sent through MyChart

## 2023-07-24 ENCOUNTER — Other Ambulatory Visit: Payer: Self-pay

## 2023-07-24 ENCOUNTER — Ambulatory Visit: Payer: BC Managed Care – PPO | Admitting: Medical

## 2023-07-24 VITALS — BP 110/64 | HR 72 | Temp 98.2°F | Wt 165.4 lb

## 2023-07-24 DIAGNOSIS — B029 Zoster without complications: Secondary | ICD-10-CM

## 2023-07-24 MED ORDER — OXYCODONE-ACETAMINOPHEN 5-325 MG PO TABS: 1.0000 | ORAL_TABLET | Freq: Three times a day (TID) | ORAL | 0 refills | Status: AC | PRN

## 2023-07-24 MED ORDER — IBUPROFEN 800 MG PO TABS: 800.0000 mg | ORAL_TABLET | Freq: Three times a day (TID) | ORAL | 0 refills | Status: DC | PRN

## 2023-07-24 MED ORDER — VALACYCLOVIR HCL 1 G PO TABS: 1000.0000 mg | ORAL_TABLET | Freq: Three times a day (TID) | ORAL | 0 refills | Status: DC

## 2023-07-24 NOTE — Progress Notes (Signed)
Subjective:  Erik Delacruz is a 52 y.o. male who presents for Chief Complaint  Patient presents with   Herpes Zoster    Possible shingles on face and head x Saturday. Had tingling on arm on Thursday and then went away then Saturday has the rash on face and and head and collar bone     Here for pain, rash, possible shingles.  Started about 3 days ago with tingling sensation and pain in the skin of his right neck and face and within a day started getting a rash as well.  He has little blisters that have been draining some fluid on his right anterior neck, behind the ear and on the scalp.  The rash is quite painful.  He has been using some over-the-counter analgesics.  No eye issues or a rash around the eye.  No prior shingles.  No other aggravating or relieving factors.    No other c/o.  Past Medical History:  Diagnosis Date   Allergic rhinitis    seasonal and with weather changes   Allergy    Back pain    since 2014, seeing Workers [employer]   Migraine headache    worse in teens, occasional as adult   Wears glasses    Current Outpatient Medications on File Prior to Visit  Medication Sig Dispense Refill   Loratadine (CLARITIN PO) Take by mouth.     Multiple Vitamins-Minerals (MULTIVITAMIN WITH MINERALS) tablet Take 1 tablet by mouth daily.     Triamcinolone Acetonide (NASACORT ALLERGY 24HR NA) Place into the nose.     No current facility-administered medications on file prior to visit.     The following portions of the patient's history were reviewed and updated as appropriate: allergies, current medications, past family history, past medical history, past social history, past surgical history and problem list.  ROS Otherwise as in subjective above    Objective: BP 110/64   Pulse 72   Temp 98.2 F (36.8 C)   Wt 165 lb 6.4 oz (75 kg)   BMI 23.07 kg/m   Physical Exam HENT:     Head:     General appearance: alert, no distress, well developed, well nourished, in pain Neck:  supple, no lymphadenopathy, no thyromegaly, no masses Skin: He has patches of vesicular lesions on erythematous base, 1 single patch on the right anterior neck and the rest are behind the right ear going up somewhat into the scalp and the C2 and C3 dermatome   Assessment: Encounter Diagnosis  Name Primary?   Herpes zoster without complication Yes     Plan: Discussed the symptoms, exam findings, suggestive of shingles outbreak.  discussed transmission, precautions to prevent spread to others, particularly high risk groups as discussed including young children, elderly, immunocompromised people, or pregnant women.  Discussed treatment including relative rest, hydration, can use OTC Cortaid topically but don't let others use the cream and discard the cream after this episode of shingles.   Begin Valtrex as below, begin  NSAID as below the next several days, and can use Percocet for worse pain sparingly.   discussed the possibility of post herpetic neuralgia.   answered their questions, After visit summary given.   Randy was seen today for herpes zoster.  Diagnoses and all orders for this visit:  Herpes zoster without complication  Other orders -     valACYclovir (VALTREX) 1000 MG tablet; Take 1 tablet (1,000 mg total) by mouth 3 (three) times daily for 10 days. -  ibuprofen (ADVIL) 800 MG tablet; Take 1 tablet (800 mg total) by mouth every 8 (eight) hours as needed. -     oxyCODONE-acetaminophen (PERCOCET/ROXICET) 5-325 MG tablet; Take 1 tablet by mouth every 8 (eight) hours as needed for up to 5 days for severe pain.    Follow up: prn

## 2023-07-25 ENCOUNTER — Other Ambulatory Visit: Payer: Self-pay | Admitting: Medical

## 2023-07-25 ENCOUNTER — Telehealth: Payer: Self-pay

## 2023-07-25 MED ORDER — ONDANSETRON HCL 4 MG PO TABS: 4.0000 mg | ORAL_TABLET | Freq: Four times a day (QID) | ORAL | 0 refills | Status: DC | PRN

## 2023-07-25 NOTE — Telephone Encounter (Signed)
Pt states he is having nausea from the antiviral. Would like something for the nausea or a different antiviral. Whichever you suggest.

## 2023-07-26 NOTE — Telephone Encounter (Signed)
Pt.notified

## 2023-07-27 NOTE — Telephone Encounter (Signed)
done

## 2023-08-01 ENCOUNTER — Telehealth: Payer: Self-pay | Admitting: Medical

## 2023-08-01 NOTE — Telephone Encounter (Signed)
Pt left message that ran out of Valtrex yesterday that he's been taking for shingles & today had lot more nausea & pain, took it for 7 days.  Should he get a refill of Valtrex or what?

## 2023-08-02 ENCOUNTER — Ambulatory Visit: Payer: BC Managed Care – PPO | Admitting: Medical

## 2023-08-02 VITALS — BP 120/70 | HR 76 | Wt 163.2 lb

## 2023-08-02 DIAGNOSIS — B028 Zoster with other complications: Secondary | ICD-10-CM | POA: Diagnosis not present

## 2023-08-02 DIAGNOSIS — R110 Nausea: Secondary | ICD-10-CM

## 2023-08-02 DIAGNOSIS — B0229 Other postherpetic nervous system involvement: Secondary | ICD-10-CM

## 2023-08-02 MED ORDER — ONDANSETRON HCL 4 MG PO TABS: 4.0000 mg | ORAL_TABLET | Freq: Four times a day (QID) | ORAL | 0 refills | Status: DC | PRN

## 2023-08-02 MED ORDER — GABAPENTIN 100 MG PO CAPS
100.0000 mg | ORAL_CAPSULE | Freq: Two times a day (BID) | ORAL | 1 refills | Status: DC
Start: 2023-08-02 — End: 2023-10-16

## 2023-08-02 MED ORDER — VALACYCLOVIR HCL 1 G PO TABS: 1000.0000 mg | ORAL_TABLET | Freq: Every day | ORAL | 0 refills | Status: AC

## 2023-08-02 NOTE — Telephone Encounter (Signed)
Called pt & he states rash has not spread close to eye but he did want to be seen for follow up, scheduled appt this afternoon

## 2023-08-02 NOTE — Patient Instructions (Signed)
Since you still have some significant discomfort from the shingles, we will use the regimen below for the time being  Recommendations: Continue ibuprofen such as 400 mg twice a day for the next 1 to 2 weeks with food.  Or you can just do this as needed Use the ondansetron Zofran as needed for nausea Begin once daily Valtrex for the next 2 weeks.  There is no good data on this but it may or may not help but it is worth a try Begin gabapentin 100 mg twice daily to help with neuropathic pain.  This can cause drowsiness. If you do not see some type of improvement within the next 5 days with the gabapentin, we can modify the dosing and increase the dosing Hopefully this regimen will give you some improvement over the next week Please call or MyChart message within the next 2 weeks to give an update

## 2023-08-02 NOTE — Progress Notes (Signed)
Subjective:  Erik Delacruz is a 52 y.o. male who presents for Chief Complaint  Patient presents with   Follow-up    Follow-up on Shingles, still lot of pain. And will get sharp pain around the ear.      Here for recheck.  I saw him 07/24/23 or shingles.  He is over 50% better, rash is scabbing now on right scalp and behind ear, but still has quite a bit of discomfort and pain in right face and scalp.   No other new symptoms.   Feels pain even on inside of body.    No decreased in appetite or being able to eat or drink.    He completed 7 days of valtrex  No other c/o.  Past Medical History:  Diagnosis Date   Allergic rhinitis    seasonal and with weather changes   Allergy    Back pain    since 2014, seeing Workers [employer]   Migraine headache    worse in teens, occasional as adult   Wears glasses    Current Outpatient Medications on File Prior to Visit  Medication Sig Dispense Refill   ibuprofen (ADVIL) 800 MG tablet Take 1 tablet (800 mg total) by mouth every 8 (eight) hours as needed. 30 tablet 0   Loratadine (CLARITIN PO) Take by mouth.     Multiple Vitamins-Minerals (MULTIVITAMIN WITH MINERALS) tablet Take 1 tablet by mouth daily.     Triamcinolone Acetonide (NASACORT ALLERGY 24HR NA) Place into the nose. (Patient not taking: Reported on 08/02/2023)     No current facility-administered medications on file prior to visit.     The following portions of the patient's history were reviewed and updated as appropriate: allergies, current medications, past family history, past medical history, past social history, past surgical history and problem list.  ROS Otherwise as in subjective above    Objective: BP 120/70   Pulse 76   Wt 163 lb 3.2 oz (74 kg)   BMI 22.76 kg/m  General appearance: alert, no distress, well developed, well nourished, in pain Neck: supple, no lymphadenopathy, no thyromegaly, no masses Skin: He has patches of crusted lesions in right scalp laterally  above and behind ear.  Oral: no lesions erythema or other abnormality    Assessment: Encounter Diagnoses  Name Primary?   Post herpetic neuralgia Yes   Herpes zoster with complication    Nausea       Plan: We discussed his symptoms so far.  He is somewhat improved but still has a good amount of pain.  We will use the regimen below and have him follow-up in 2 weeks with a phone call  Patient Instructions  Since you still have some significant discomfort from the shingles, we will use the regimen below for the time being  Recommendations: Continue ibuprofen such as 400 mg twice a day for the next 1 to 2 weeks with food.  Or you can just do this as needed Use the ondansetron Zofran as needed for nausea Begin once daily Valtrex for the next 2 weeks.  There is no good data on this but it may or may not help but it is worth a try Begin gabapentin 100 mg twice daily to help with neuropathic pain.  This can cause drowsiness. If you do not see some type of improvement within the next 5 days with the gabapentin, we can modify the dosing and increase the dosing Hopefully this regimen will give you some improvement over the next week  Please call or MyChart message within the next 2 weeks to give an update      Erik Delacruz was seen today for follow-up.  Diagnoses and all orders for this visit:  Post herpetic neuralgia  Herpes zoster with complication  Nausea  Other orders -     ondansetron (ZOFRAN) 4 MG tablet; Take 1 tablet (4 mg total) by mouth every 6 (six) hours as needed for nausea or vomiting. -     valACYclovir (VALTREX) 1000 MG tablet; Take 1 tablet (1,000 mg total) by mouth daily. -     gabapentin (NEURONTIN) 100 MG capsule; Take 1 capsule (100 mg total) by mouth 2 (two) times daily.   Follow up: call report in 2 weeks

## 2023-08-10 ENCOUNTER — Ambulatory Visit: Payer: BC Managed Care – PPO | Admitting: Family Medicine

## 2023-08-10 ENCOUNTER — Encounter: Payer: Self-pay | Admitting: Family Medicine

## 2023-08-10 VITALS — BP 120/86 | HR 100 | Ht 70.0 in | Wt 160.0 lb

## 2023-08-10 DIAGNOSIS — M545 Low back pain, unspecified: Secondary | ICD-10-CM

## 2023-08-10 LAB — POCT URINALYSIS DIP (PROADVANTAGE DEVICE)
Bilirubin, UA: NEGATIVE
Blood, UA: NEGATIVE
Glucose, UA: NEGATIVE mg/dL
Ketones, POC UA: NEGATIVE mg/dL
Leukocytes, UA: NEGATIVE
Nitrite, UA: NEGATIVE
Protein Ur, POC: NEGATIVE mg/dL
Specific Gravity, Urine: 1.015
Urobilinogen, Ur: 0.2
pH, UA: 6 (ref 5.0–8.0)

## 2023-08-10 MED ORDER — MELOXICAM 15 MG PO TABS: 15.0000 mg | ORAL_TABLET | Freq: Every day | ORAL | 0 refills | Status: DC

## 2023-08-10 MED ORDER — KETOROLAC TROMETHAMINE 60 MG/2ML IM SOLN
60.0000 mg | Freq: Once | INTRAMUSCULAR | Status: AC
  Administered 2023-08-10: 60 mg via INTRAMUSCULAR

## 2023-08-10 MED ORDER — METHOCARBAMOL 500 MG PO TABS: 500.0000 mg | ORAL_TABLET | Freq: Three times a day (TID) | ORAL | 0 refills | Status: DC | PRN

## 2023-08-10 NOTE — Patient Instructions (Signed)
  Take meloxicam once daily with food.  Wait 6 hours from today's injection. Do NOT take any ibuprofen along with the meloxicam. You MAY take tylenol (acetaminophen) as needed. Do not use any other over-the-counter pain medications (such as advil/motrin/aleve/naproxen/Goody or BC powder)  Take the muscle relaxants as needed. Continue heat, TENS unit.  I recommend massage and stretches. You can also try ice (whichever works best, sometimes alternating heat and ice works well).  Contact us for a referral to physical therapy if not improving (Some places do not require a referral--[facility] and [facility] are both great, and don't require referrals.)  Contact us in the next 1-2 days if stronger pain medication is needed. This masks the pain, it doesn't treat the underlying cause, so you would still want to use the meloxicam and muscle relaxant, if needed.

## 2023-08-10 NOTE — Progress Notes (Signed)
Chief Complaint  Patient presents with   Back Pain    Has had back pain off and on x 10 years. Just had shingles, so he thinks he may have been less active. Stood up from the couch Tues am, think whatever pain he experienced was nerve related as it was the worst pain he has ever felt. Sat back down in a weird position due to the pain. Has used some gabapentin increased to 3 in the am and 3 in the evening as well as ibuprofen BID.    Patient presents with acute onset of pain at the base of his spine/sacral area when he got up from the couch 2 days ago. He describes it as hard, like a horse kick (not sharp like an ice pick). Had to crawl to the bathroom.  He was later able to walk upright.  Had a cane from 2011 (for shoulder PT), which helped.  He had intense spasms of pain coming and going.  That day he used [employer]. Hot shower helped.  Hasn't used heating pad yet (has one). Increased pain with cough, painful to push for a BM. Using a TENS unit today, which helps a little.  PMH, PSH, SH reviewed-- Saw Vincenza Hews 10/14 with shingles (R face/scalp), seen in f/u 07/2022 still with a lot of pain. Diagnosed with PHN.  He has ben taking ibuprofen 400 mg BID (only 1/2 tablet of the 800mg  that was prescribed), zofran prn. He was also prescribed Valtrex to take daily x 2 weeks (but he hasn't taken it since 10/28, pain improved), and Gabapentin 100 BID, increased to 2 BID. Not aware of the discomfort in head any longer, as the competing pain of his low back is much worse. Rash has resolved.  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 08/10/2023: Today is taking 200mg  TID   ibuprofen (ADVIL) 800 MG tablet Take 1 tablet (800 mg total) by mouth every 8 (eight) hours as  needed. 08/10/2023: Taking 400mg  BID   ondansetron (ZOFRAN) 4 MG tablet Take 1 tablet (4 mg total) by mouth every 6 (six) hours as needed for nausea or vomiting. 08/10/2023: Took yesterday   Triamcinolone Acetonide (NASACORT AQ NA) Place 1 spray into the nose daily.    Loratadine (CLARITIN PO) Take by mouth. (Patient not taking: Reported on 08/10/2023)    Multiple Vitamins-Minerals (MULTIVITAMIN WITH MINERALS) tablet Take 1 tablet by mouth daily. (Patient not taking: Reported on 08/10/2023)    valACYclovir (VALTREX) 1000 MG tablet Take 1 tablet (1,000 mg total) by mouth daily. (Patient not taking: Reported on 08/10/2023) 08/10/2023: Stopped on Tuesday   [DISCONTINUED] Triamcinolone Acetonide (NASACORT ALLERGY 24HR NA) Place into the nose. (Patient not taking: Reported on 08/02/2023)    No facility-administered encounter medications on file as of 08/10/2023.   No Known Allergies   ROS: LBP per HPI.  PHN of R face/ear improved. No fever. Some chills the other night, better after eating. Legs are slightly jumpy, some twitching the other night. No numbness/tingling.  Unable to tell weakness from pain (hard to lift either leg to get into bed). Seems to be more on the left than the right. No bowel or bladder dysfunction.  No GI complaints.   PHYSICAL EXAM:  BP 120/86  Pulse 100   Ht 5\' 10"  (1.778 m)   Wt 160 lb (72.6 kg)   BMI 22.96 kg/m   Pleasant male, in moderate discomfort with any movements/position changes. HEENT: conjunctiva and sclera are clear, EOMI. Neck: no lymphadenopathy or mass Heart: regular rate and rhythm Lungs: clear bilaterally Back: no CVA tenderness.  Tender at lower lumbar spine centrally, nontender elsewhere on spine. Some tenderness at L paraspinous muscles, L deep buttock muscles, and at L SI joint. Minimal tenderness on the right. He had pain in L lower back and buttock with SLR on EITHER leg. Able to do pyriformis stretches without pain, good ROM, no  spasm. Neuro: Normal strength--some giving away due to pain with L hip flexion against resistance.  Slow gait due to pain. DTR's are 2+ and symmetric at knees and ankles. Psych: normal mood, affect, hygiene and grooming   Urine: SG 1.015, negative   ASSESSMENT/PLAN:  Low back pain without sciatica, unspecified back pain laterality, unspecified chronicity - muscular component, normal neuro exam. NSAID, muscle relaxant, heat (+/-ice), topicals, massage/stretches. PT if not better - Plan: POCT Urinalysis DIP (Proadvantage Device), meloxicam (MOBIC) 15 MG tablet, methocarbamol (ROBAXIN) 500 MG tablet, ketorolac (TORADOL) injection 60 mg  Risks/SE of all meds reviewed in detail. Postherpetic neuralgia pain has resolved (or not noticeable due to competing pain).   Toradol injection 60mg  given.  Aware to wait 6 hours before starting meloxicam, with food. NSAID precautions reviewed. To continue Tens. If still with severe pain, he can contact us and we can cover him through the weekend with a small supply of Tramadol. Discussed PT if not improving. To contact us if new sx develop--sciatica, weakness.      Take meloxicam once daily with food.  Wait 6 hours from today's injection. Do NOT take any ibuprofen along with the meloxicam. You MAY take tylenol (acetaminophen) as needed. Do not use any other over-the-counter pain medications (such as advil/motrin/aleve/naproxen/Goody or BC powder)  Take the muscle relaxants as needed. Continue heat, TENS unit.  I recommend massage and stretches. You can also try ice (whichever works best, sometimes alternating heat and ice works well).  Contact us for a referral to physical therapy if not improving (Some places do not require a referral--[facility] and [facility] are both great, and don't require referrals.)  Contact us in the next 1-2 days if stronger pain medication is needed. This masks the pain, it doesn't treat the  underlying cause, so you would still want to use the meloxicam and muscle relaxant, if needed.

## 2023-08-11 ENCOUNTER — Encounter: Payer: Self-pay | Admitting: Family Medicine

## 2023-08-11 MED ORDER — TRAMADOL HCL 50 MG PO TABS: 50.0000 mg | ORAL_TABLET | Freq: Three times a day (TID) | ORAL | 0 refills | Status: DC | PRN

## 2023-08-15 ENCOUNTER — Telehealth: Payer: Self-pay | Admitting: Medical

## 2023-08-15 NOTE — Telephone Encounter (Signed)
P.A. TRAMADOL done & approved yesterday, pt informed

## 2023-10-12 ENCOUNTER — Other Ambulatory Visit: Payer: Self-pay | Admitting: Medical

## 2023-10-12 MED ORDER — VALACYCLOVIR HCL 1 G PO TABS: 1000.0000 mg | ORAL_TABLET | Freq: Three times a day (TID) | ORAL | 0 refills | Status: AC

## 2023-10-16 ENCOUNTER — Other Ambulatory Visit: Payer: Self-pay | Admitting: Medical

## 2023-10-16 MED ORDER — GABAPENTIN 100 MG PO CAPS: 100.0000 mg | ORAL_CAPSULE | Freq: Two times a day (BID) | ORAL | 1 refills | Status: DC

## 2024-03-05 ENCOUNTER — Ambulatory Visit: Payer: BC Managed Care – PPO | Admitting: Medical

## 2024-03-05 ENCOUNTER — Encounter: Payer: Self-pay | Admitting: Medical

## 2024-03-05 VITALS — BP 110/68 | HR 72 | Ht 71.0 in | Wt 167.8 lb

## 2024-03-05 DIAGNOSIS — Z13220 Encounter for screening for lipoid disorders: Secondary | ICD-10-CM

## 2024-03-05 DIAGNOSIS — G43909 Migraine, unspecified, not intractable, without status migrainosus: Secondary | ICD-10-CM | POA: Insufficient documentation

## 2024-03-05 DIAGNOSIS — Z7185 Encounter for immunization safety counseling: Secondary | ICD-10-CM | POA: Diagnosis not present

## 2024-03-05 DIAGNOSIS — R1110 Vomiting, unspecified: Secondary | ICD-10-CM

## 2024-03-05 DIAGNOSIS — Z0000 Encounter for general adult medical examination without abnormal findings: Secondary | ICD-10-CM | POA: Diagnosis not present

## 2024-03-05 DIAGNOSIS — Z125 Encounter for screening for malignant neoplasm of prostate: Secondary | ICD-10-CM | POA: Insufficient documentation

## 2024-03-05 DIAGNOSIS — Z1211 Encounter for screening for malignant neoplasm of colon: Secondary | ICD-10-CM | POA: Insufficient documentation

## 2024-03-05 DIAGNOSIS — Z136 Encounter for screening for cardiovascular disorders: Secondary | ICD-10-CM
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
## 2024-03-06 ENCOUNTER — Ambulatory Visit: Payer: Self-pay | Admitting: Medical

## 2024-03-06 LAB — COMPREHENSIVE METABOLIC PANEL WITH GFR
ALT: 42 IU/L (ref 0–44)
AST: 28 IU/L (ref 0–40)
Albumin: 4.4 g/dL (ref 4.1–5.1)
Alkaline Phosphatase: 73 IU/L (ref 44–121)
BUN/Creatinine Ratio: 14 (ref 9–20)
BUN: 13 mg/dL (ref 6–24)
Bilirubin Total: 0.5 mg/dL (ref 0.0–1.2)
CO2: 18 mmol/L — ABNORMAL LOW (ref 20–29)
Calcium: 9.6 mg/dL (ref 8.7–10.2)
Chloride: 106 mmol/L (ref 96–106)
Creatinine, Ser: 0.96 mg/dL (ref 0.76–1.27)
Globulin, Total: 2.9 g/dL (ref 1.5–4.5)
Glucose: 110 mg/dL — ABNORMAL HIGH (ref 70–99)
Potassium: 4.3 mmol/L (ref 3.5–5.2)
Sodium: 137 mmol/L (ref 134–144)
Total Protein: 7.3 g/dL (ref 6.0–8.5)
eGFR: 96 mL/min/{1.73_m2} (ref >59)

## 2024-03-06 LAB — CBC
Hematocrit: 42.8 % (ref 37.5–51.0)
Hemoglobin: 14.5 g/dL (ref 13.0–17.7)
MCH: 29.5 pg (ref 26.6–33.0)
MCHC: 33.9 g/dL (ref 31.5–35.7)
MCV: 87 fL (ref 79–97)
Platelets: 319 10*3/uL (ref 150–450)
RBC: 4.91 x10E6/uL (ref 4.14–5.80)
RDW: 12.1 % (ref 11.6–15.4)
WBC: 6.7 10*3/uL (ref 3.4–10.8)

## 2024-03-06 LAB — LIPID PANEL
Chol/HDL Ratio: 4.2 ratio (ref 0.0–5.0)
Cholesterol, Total: 187 mg/dL (ref 100–199)
HDL: 45 mg/dL (ref >39)
LDL Chol Calc (NIH): 124 mg/dL — ABNORMAL HIGH (ref 0–99)
Triglycerides: 98 mg/dL (ref 0–149)
VLDL Cholesterol Cal: 18 mg/dL (ref 5–40)

## 2024-03-06 LAB — TSH: TSH: 2.12 u[IU]/mL (ref 0.450–4.500)

## 2024-03-06 LAB — PSA: Prostate Specific Ag, Serum: 0.8 ng/mL (ref 0.0–4.0)

## 2024-03-06 NOTE — Addendum Note (Signed)
 Addended by: Charliene Conte A on: 03/06/2024 01:26 PM   Modules accepted: Orders

## 2024-03-06 NOTE — Progress Notes (Signed)
 Results sent through MyChart

## 2024-04-24 ENCOUNTER — Ambulatory Visit (AMBULATORY_SURGERY_CENTER): Payer: Self-pay

## 2024-04-24 ENCOUNTER — Encounter: Payer: Self-pay | Admitting: Internal Medicine

## 2024-04-24 VITALS — Ht 71.0 in | Wt 163.0 lb

## 2024-04-24 DIAGNOSIS — Z1211 Encounter for screening for malignant neoplasm of colon: Secondary | ICD-10-CM

## 2024-04-24 MED ORDER — NA SULFATE-K SULFATE-MG SULF 17.5-3.13-1.6 GM/177ML PO SOLN: 1.0000 | Freq: Once | ORAL | 0 refills | Status: AC

## 2024-04-24 NOTE — Progress Notes (Signed)

## 2024-05-15 ENCOUNTER — Ambulatory Visit (AMBULATORY_SURGERY_CENTER): Payer: Self-pay | Admitting: Internal Medicine

## 2024-05-15 ENCOUNTER — Encounter: Payer: Self-pay | Admitting: Internal Medicine

## 2024-05-15 VITALS — BP 89/51 | HR 73 | Temp 98.4°F | Resp 12 | Ht 71.0 in | Wt 163.0 lb

## 2024-05-15 DIAGNOSIS — Z1211 Encounter for screening for malignant neoplasm of colon: Secondary | ICD-10-CM | POA: Diagnosis present

## 2024-05-15 MED ORDER — SODIUM CHLORIDE 0.9 % IV SOLN: 500.0000 mL | Freq: Once | INTRAVENOUS | Status: DC

## 2024-05-15 NOTE — Op Note (Signed)
 [facility] Patient Name: Erik Delacruz Procedure Date: 05/15/2024 8:47 AM MRN: [id number] Endoscopist: Norleen SAILOR. Abran , MD, [id number] Age: 53 Referring MD:  Date of Birth: 11-May-1971 Gender: Male Account #: [id number] Procedure:                Colonoscopy Indications:              Screening for colorectal malignant neoplasm Medicines:                Monitored Anesthesia Care Procedure:                Pre-Anesthesia Assessment:                           - Prior to the procedure, a History and Physical                            was performed, and patient medications and                            allergies were reviewed. The patient's tolerance of                            previous anesthesia was also reviewed. The risks                            and benefits of the procedure and the sedation                            options and risks were discussed with the patient.                            All questions were answered, and informed consent                            was obtained. Prior Anticoagulants: The patient has                            taken no anticoagulant or antiplatelet agents. ASA                            Grade Assessment: II - A patient with mild systemic                            disease. After reviewing the risks and benefits,                            the patient was deemed in satisfactory condition to                            undergo the procedure.                           After obtaining informed consent, the colonoscope  was passed under direct vision. Throughout the                            procedure, the patient's blood pressure, pulse, and                            oxygen saturations were monitored continuously. The                            CF [id number] #[id number] was introduced through the anus                            and advanced to the the cecum, identified by                            appendiceal orifice  and ileocecal valve. The                            ileocecal valve, appendiceal orifice, and rectum                            were photographed. The quality of the bowel                            preparation was excellent. The colonoscopy was                            performed without difficulty. The patient tolerated                            the procedure well. The bowel preparation used was                            SUPREP via split dose instruction. Scope In: 9:01:56 AM Scope Out: 9:12:17 AM Scope Withdrawal Time: 0 hours 8 minutes 8 seconds  Total Procedure Duration: 0 hours 10 minutes 21 seconds  Findings:                 The entire examined colon appeared normal on direct                            and retroflexion views. Complications:            No immediate complications. Estimated blood loss:                            None. Estimated Blood Loss:     Estimated blood loss: none. Impression:               - The entire examined colon is normal on direct and                            retroflexion views.                           - No  specimens collected. Recommendation:           - Repeat colonoscopy in 10 years for screening                            purposes.                           - Patient has a contact number available for                            emergencies. The signs and symptoms of potential                            delayed complications were discussed with the                            patient. Return to normal activities tomorrow.                            Written discharge instructions were provided to the                            patient.                           - Resume previous diet.                           - Continue present medications. Norleen SAILOR. Abran, MD 05/15/2024 9:15:54 AM This report has been signed electronically.

## 2024-05-15 NOTE — Progress Notes (Signed)
 Pt's states no medical or surgical changes since previsit or office visit.

## 2024-05-15 NOTE — Patient Instructions (Signed)
 YOU HAD AN ENDOSCOPIC PROCEDURE TODAY AT THE  ENDOSCOPY CENTER:   Refer to the procedure report that was given to you for any specific questions about what was found during the examination.  If the procedure report does not answer your questions, please call your gastroenterologist to clarify.  If you requested that your care partner not be given the details of your procedure findings, then the procedure report has been included in a sealed envelope for you to review at your convenience later.  YOU SHOULD EXPECT: Some feelings of bloating in the abdomen. Passage of more gas than usual.  Walking can help get rid of the air that was put into your GI tract during the procedure and reduce the bloating. If you had a lower endoscopy (such as a colonoscopy or flexible sigmoidoscopy) you may notice spotting of blood in your stool or on the toilet paper. If you underwent a bowel prep for your procedure, you may not have a normal bowel movement for a few days.  Please Note:  You might notice some irritation and congestion in your nose or some drainage.  This is from the oxygen used during your procedure.  There is no need for concern and it should clear up in a day or so.  SYMPTOMS TO REPORT IMMEDIATELY:  Following lower endoscopy (colonoscopy or flexible sigmoidoscopy):  Excessive amounts of blood in the stool  Significant tenderness or worsening of abdominal pains  Swelling of the abdomen that is new, acute  Fever of 100F or higher  Resume previous diet Continue present medications   For urgent or emergent issues, a gastroenterologist can be reached at any hour by calling [phone]. Do not use MyChart messaging for urgent concerns.    DIET:  We do recommend a small meal at first, but then you may proceed to your regular diet.  Drink plenty of fluids but you should avoid alcoholic beverages for 24 hours.  ACTIVITY:  You should plan to take it easy for the rest of today and you should NOT  DRIVE or use heavy machinery until tomorrow (because of the sedation medicines used during the test).    FOLLOW UP: Our staff will call the number listed on your records the next business day following your procedure.  We will call around 7:15- 8:00 am to check on you and address any questions or concerns that you may have regarding the information given to you following your procedure. If we do not reach you, we will leave a message.     If any biopsies were taken you will be contacted by phone or by letter within the next 1-3 weeks.  Please call us  at [phone] if you have not heard about the biopsies in 3 weeks.    SIGNATURES/CONFIDENTIALITY: You and/or your care partner have signed paperwork which will be entered into your electronic medical record.  These signatures attest to the fact that that the information above on your After Visit Summary has been reviewed and is understood.  Full responsibility of the confidentiality of this discharge information lies with you and/or your care-partner.

## 2024-05-15 NOTE — Progress Notes (Signed)
 Sedate, gd SR, tolerated procedure well, VSS, report to RN
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
## 2024-05-16 ENCOUNTER — Telehealth: Payer: Self-pay | Admitting: *Deleted

## 2024-05-16 NOTE — Telephone Encounter (Signed)
  Follow up Call-     05/15/2024    7:36 AM  Call back number  Post procedure Call Back phone  # [phone]  Permission to leave phone message Yes   Left message on machine to call back if any questions or concerns

## 2025-03-11 ENCOUNTER — Encounter: Payer: Self-pay | Admitting: Medical
